# Patient Record
Sex: Male | Born: 1970 | Race: Black or African American | Hispanic: No | Marital: Single | Smoking: Never smoker
Health system: Southern US, Community
[De-identification: ages and names within clinical notes are randomized; demographics above are authoritative.]

## PROBLEM LIST (undated history)

## (undated) DIAGNOSIS — S43006A Unspecified dislocation of unspecified shoulder joint, initial encounter: Secondary | ICD-10-CM

---

## 1998-01-11 ENCOUNTER — Encounter: Admission: RE | Admit: 1998-01-11 | Discharge: 1998-01-11 | Payer: Self-pay | Admitting: *Deleted

## 1999-07-11 ENCOUNTER — Encounter: Payer: Self-pay | Admitting: Emergency Medicine

## 1999-07-11 ENCOUNTER — Emergency Department (HOSPITAL_COMMUNITY): Admission: EM | Admit: 1999-07-11 | Discharge: 1999-07-11 | Payer: Self-pay | Admitting: Emergency Medicine

## 2001-05-16 ENCOUNTER — Emergency Department (HOSPITAL_COMMUNITY): Admission: EM | Admit: 2001-05-16 | Discharge: 2001-05-16 | Payer: Self-pay | Admitting: Emergency Medicine

## 2002-02-02 ENCOUNTER — Inpatient Hospital Stay (HOSPITAL_COMMUNITY): Admission: AD | Admit: 2002-02-02 | Discharge: 2002-02-04 | Payer: Self-pay | Admitting: Internal Medicine

## 2002-02-04 ENCOUNTER — Encounter: Payer: Self-pay | Admitting: Internal Medicine

## 2002-11-05 ENCOUNTER — Emergency Department (HOSPITAL_COMMUNITY): Admission: EM | Admit: 2002-11-05 | Discharge: 2002-11-05 | Payer: Self-pay | Admitting: Emergency Medicine

## 2002-11-05 ENCOUNTER — Encounter: Payer: Self-pay | Admitting: Emergency Medicine

## 2006-11-04 ENCOUNTER — Emergency Department (HOSPITAL_COMMUNITY): Admission: EM | Admit: 2006-11-04 | Discharge: 2006-11-04 | Payer: Self-pay | Admitting: Emergency Medicine

## 2007-05-01 ENCOUNTER — Emergency Department (HOSPITAL_COMMUNITY): Admission: EM | Admit: 2007-05-01 | Discharge: 2007-05-01 | Payer: Self-pay | Admitting: Emergency Medicine

## 2010-10-20 ENCOUNTER — Inpatient Hospital Stay (INDEPENDENT_AMBULATORY_CARE_PROVIDER_SITE_OTHER)
Admission: RE | Admit: 2010-10-20 | Discharge: 2010-10-20 | Disposition: A | Discharge status: Home / Self Care | Payer: PRIVATE HEALTH INSURANCE | Source: Ambulatory Visit | Attending: Emergency Medicine | Admitting: Emergency Medicine

## 2010-10-20 DIAGNOSIS — J309 Allergic rhinitis, unspecified: Secondary | ICD-10-CM

## 2010-12-14 NOTE — Discharge Summary (Signed)
[place]. [facility]  Patient:    Andrew Potter, Andrew Potter Visit Number: [id number] MRN: [id number]          Service Type: MED Location: 5500 [id number] 02 Attending Physician:  Alva Garnet. Dictated by:   Merlene Laughter Renae Gloss, M.D. Admit Date:  02/02/2002 Discharge Date: 02/04/2002                             Discharge Summary  DISCHARGE DIAGNOSES: 1. Acute pancreatitis, resolved. 2. Nausea and vomiting. 3. Elevated liver function tests. 4. Human immunodeficiency virus.  HOSPITAL COURSE:  [name] was admitted for the diagnosis and evaluation of acute pancreatitis.  He had weight loss as an outpatient secondary to nausea, vomiting and inability to tolerate oral intake.  He has been on several retroviral medications.  All of which may have contributed to nausea, vomiting and increased liver function tests.  HIV consultation was obtained per Dr. Maurice March.  He states Zerit, D4T, is especially known for pancreatitis.  Zerit is one of [name] original medications.  Other causes of possible pancreatitis and increased liver function tests such as gallbladder disease was ruled out with a normal abdominal ultrasound today.  At discharge, [name] was eating without complications.  He did have some residual nausea; however, his symptoms have certainly improved off of his HIV medications.  He was ambulating without complications.  DISCHARGE MEDICATIONS: 1. Phenergan 25 mg one p.o. q.4h. p.r.n. nausea. 2. [name] will hold on his current medical regimen of Zerit, Epivir and    AZT.  As per ID recommendations, he will be started on a combination of    Caltro 3 p.o. b.i.d., Epivir 150 mg p.o. b.i.d. or Viread 200 mg    per day or Caltro, Viread and Sustiva 600 mg per day.  FOLLOW-UP: [name] will be seen by Dr. Renae Gloss within two weeks following discharge. Dictated by:   Merlene Laughter Renae Gloss, M.D. Attending Physician:  Andi Devon R. DD:  02/04/02 TD:   02/08/02 Job: [id number]

## 2010-12-14 NOTE — H&P (Signed)
[place]. [facility]  Patient:    Andrew Potter, Andrew Potter Visit Number: [id number] MRN: [id number]          Service Type: MED Location: 5500 5531 02 Attending Physician:  Alva Garnet. Dictated by:   Merlene Laughter Renae Gloss, M.D. Admit Date:  02/02/2002 Discharge Date: 02/04/2002                           History and Physical  CHIEF COMPLAINT: Nausea, vomiting, acute pancreatitis.  HISTORY OF PRESENT ILLNESS: The patient is a 40 year old gentleman who complains of a several week history of progressive nausea and vomiting and epigastric or abdominal pain.  He had been treated as an outpatient; however, states that Proton pump inhibitor therapy had not been helpful and he continued to have significant nausea and vomiting.  He had been unable to tolerate oral intake and a 10 pound weight loss had been noted over the past six weeks.  Outpatient laboratory evaluation was unremarkable with the exception of elevated lipase at 120.  He is admitted for further evaluation and treatment of acute pancreatitis.  ALLERGIES: NKDA.  MEDICATIONS:  1. Zerit 40 mg one p.o. q.d.  2. Epivir 150 mg one p.o. b.i.d.  3. Viramune 200 mg one p.o. b.i.d.  PAST MEDICAL HISTORY: HIV.  SOCIAL HISTORY: The patient is employed in the school system.  He denies tobacco, alcohol, or drugs of abuse.  FAMILY HISTORY: Significant for diabetes in grandmother.  REVIEW OF SYSTEMS: As per HPI and patient health history assessment.  Greater than 10 systems were reviewed.  PHYSICAL EXAMINATION:  GENERAL APPEARANCE: Well-developed, well-nourished black male in no acute distress.  VITAL SIGNS: Blood pressure 136/90, pulse 64.  Weight 128 pounds.  Temperature 97.6 degrees.  HEENT: TMs within normal limits bilaterally.  No oropharyngeal lesions.  NECK: Supple.  No masses.  Carotids 2+.  No bruits.  LUNGS: Clear to auscultation bilaterally.  HEART: S1 and S2.  Regular rate and rhythm.   No murmurs, rubs, or gallops.  ABDOMEN: Soft, nondistended.  Mild epigastric abdominal pain to palpation.  No masses.  Normoactive bowel sounds.  EXTREMITIES: No clubbing, cyanosis, or edema.  Pulses 2+ throughout.  NEUROLOGIC: Alert and oriented x3.  Cranial nerves intact.  ASSESSMENT/PLAN: Acute pancreatitis, most probable etiology is medication related.  All of his human immunodeficiency virus medications can cause gastrointestinal side effects such as nausea, vomiting, anorexia, and elevated liver function tests.  Whether or not this flare of pancreatitis is a result of his medications is unclear.  Human immunodeficiency virus disease also places him at higher risk for developing pancreatitis.  The patient also has slightly elevated liver function tests.  This along with his presentation may also indicate gallbladder disease.  An abdominal ultrasound will be obtained while hospitalized.  Of course, other possible etiologies could include gastroesophageal reflux disease or ulcer.  We will continue Proton pump inhibitor therapy.  He is hemodynamically stable and may require further gastrointestinal evaluation as an outpatient.Dictated by: Merlene Laughter Renae Gloss, M.D. Attending Physician:  Andi Devon R. DD:  02/03/02 TD:  02/06/02 Job: [id number]

## 2013-08-13 ENCOUNTER — Emergency Department (HOSPITAL_COMMUNITY)
Admission: EM | Admit: 2013-08-13 | Discharge: 2013-08-13 | Disposition: A | Discharge status: Home / Self Care | Payer: Self-pay | Attending: Emergency Medicine | Admitting: Emergency Medicine

## 2013-08-13 ENCOUNTER — Emergency Department (HOSPITAL_COMMUNITY): Payer: Self-pay

## 2013-08-13 ENCOUNTER — Encounter (HOSPITAL_COMMUNITY): Payer: Self-pay | Admitting: Emergency Medicine

## 2013-08-13 DIAGNOSIS — S43109A Unspecified dislocation of unspecified acromioclavicular joint, initial encounter: Secondary | ICD-10-CM | POA: Insufficient documentation

## 2013-08-13 DIAGNOSIS — Y929 Unspecified place or not applicable: Secondary | ICD-10-CM | POA: Insufficient documentation

## 2013-08-13 DIAGNOSIS — S43006A Unspecified dislocation of unspecified shoulder joint, initial encounter: Secondary | ICD-10-CM

## 2013-08-13 DIAGNOSIS — X500XXA Overexertion from strenuous movement or load, initial encounter: Secondary | ICD-10-CM | POA: Insufficient documentation

## 2013-08-13 DIAGNOSIS — Y9389 Activity, other specified: Secondary | ICD-10-CM | POA: Insufficient documentation

## 2013-08-13 MED ORDER — IBUPROFEN 800 MG PO TABS
800.0000 mg | ORAL_TABLET | Freq: Once | ORAL | Status: AC
  Administered 2013-08-13: 800 mg via ORAL
  Filled 2013-08-13: qty 1

## 2013-08-13 NOTE — Discharge Instructions (Signed)
Shoulder Dislocation ° Shoulder dislocation is when your upper arm bone (humerus) is forced out of your shoulder joint. Your doctor will put your shoulder back into the joint by pulling on your arm or through surgery. Your arm will be placed in a shoulder immobilizer or sling. The shoulder immobilizer or sling holds your shoulder in place while it heals. °HOME CARE  °· Rest your injured joint. Do not move it until instructed to do so. °· Put ice on your injured joint as told by your doctor. °· Put ice in a plastic bag. °· Place a towel between your skin and the bag. °· Leave the ice on for 15-20 minutes at a time, every 2 hours while you are awake. °· Only take medicines as told by your doctor. °· Squeeze a ball to exercise your hand. °GET HELP RIGHT AWAY IF:  °· Your splint or sling becomes damaged. °· Your pain becomes worse, not better. °· You lose feeling in your arm or hand. °· Your arm or hand becomes white or cold. °MAKE SURE YOU:  °· Understand these instructions. °· Will watch your condition. °· Will get help right away if you are not doing well or get worse. °Document Released: 10/07/2011 Document Reviewed: 10/07/2011 °ExitCare® Patient Information ©2014 [employer]. ° °

## 2013-08-13 NOTE — ED Notes (Signed)
Pt to room from x-ray.  St's feels like shoulder is back in place.  No deformity present at this time.

## 2013-08-13 NOTE — ED Provider Notes (Signed)
CSN: [id number]     Arrival date & time 08/13/13  2047 History   First MD Initiated Contact with Patient 08/13/13 2144     Chief Complaint  Patient presents with  . Shoulder Injury   (Consider location/radiation/quality/duration/timing/severity/associated sxs/prior Treatment) HPI 43 year old man who was lifting a table today and felt his shoulder come out of joint. He has had prior shoulder dislocations. He states that when he moved it here in the department he felt it slip back in. He has not had any numbness, tingling, or weakness in the extremity distal to this. He has had shoulder dislocations in the past and has followed up with an orthopedist but is unable to remember once. He has not had any surgery.  History reviewed. No pertinent past medical history. History reviewed. No pertinent past surgical history. No family history on file. History  Substance Use Topics  . Smoking status: Never Smoker   . Smokeless tobacco: Not on file  . Alcohol Use: Yes    Review of Systems  All other systems reviewed and are negative.    Allergies  Review of patient's allergies indicates no known allergies.  Home Medications   Current Outpatient Rx  Name  Route  Sig  Dispense  Refill  . efavirenz-emtricitabine-tenofovir (ATRIPLA) 600-200-300 MG per tablet   Oral   Take 1 tablet by mouth at bedtime.          BP 147/96  Pulse 98  Temp(Src) 98.1 F (36.7 C) (Oral)  Resp 16  SpO2 97% Physical Exam  Nursing note and vitals reviewed. Constitutional: He is oriented to person, place, and time. He appears well-developed and well-nourished.  HENT:  Head: Normocephalic and atraumatic.  Right Ear: External ear normal.  Left Ear: External ear normal.  Eyes: Conjunctivae are normal. Pupils are equal, round, and reactive to light.  Neck: Normal range of motion. Neck supple.  Cardiovascular: Normal rate, regular rhythm, normal heart sounds and intact distal pulses.   Pulmonary/Chest: Effort  normal and breath sounds normal.  Abdominal: Soft. Bowel sounds are normal.  Musculoskeletal: Normal range of motion. He exhibits no edema.       Arms: Shoulder with normal appearing contours.  Not ranged.  Elbow and hand normal. All sensation intact distal to injury.    Neurological: He is oriented to person, place, and time. He has normal reflexes.  Skin: Skin is warm and dry.  Psychiatric: He has a normal mood and affect. His behavior is normal. Judgment and thought content normal.    ED Course  Procedures (including critical care time) Labs Review Labs Reviewed - No data to display Imaging Review Dg Shoulder Left  08/13/2013   CLINICAL DATA:  Injury to left shoulder while moving heavy boxes; left lateral shoulder pain.  EXAM: LEFT SHOULDER - 2+ VIEW  COMPARISON:  Left shoulder radiographs performed 05/01/2007  FINDINGS: There is no evidence of fracture or dislocation. The left humeral head is seated within the glenoid fossa. The acromioclavicular joint is unremarkable in appearance. No significant soft tissue abnormalities are seen. The visualized portions of the left lung are clear.  IMPRESSION: No evidence of fracture or dislocation.   Electronically Signed   By: Roanna RaiderJeffery  Chang M.D.   On: 08/13/2013 21:58    EKG Interpretation   None       MDM  No diagnosis found. 43 year old male with history of left shoulder dislocations who dislocated his arm today lifting and has apparently spontaneously relocated it prior to x-[place]itt [place]itt. There  is no evidence of fracture in his neuro vascularly intact. He will be placed in an immobilizer and is given referral to orthopedic surgery  Hilario Quarry, MD 08/13/13 2256

## 2013-08-13 NOTE — ED Notes (Signed)
The pt is c/o lt shoulder pain.  He was lifting a couch when his shoulder dislocated.  He has a history of dislocated shoulders on the lt

## 2013-08-20 ENCOUNTER — Emergency Department (HOSPITAL_COMMUNITY): Payer: PRIVATE HEALTH INSURANCE

## 2013-08-20 ENCOUNTER — Encounter (HOSPITAL_COMMUNITY): Payer: Self-pay | Admitting: Emergency Medicine

## 2013-08-20 ENCOUNTER — Emergency Department (HOSPITAL_COMMUNITY)
Admission: EM | Admit: 2013-08-20 | Discharge: 2013-08-20 | Disposition: A | Discharge status: Home / Self Care | Payer: PRIVATE HEALTH INSURANCE | Attending: Emergency Medicine | Admitting: Emergency Medicine

## 2013-08-20 DIAGNOSIS — Y9389 Activity, other specified: Secondary | ICD-10-CM | POA: Insufficient documentation

## 2013-08-20 DIAGNOSIS — X500XXA Overexertion from strenuous movement or load, initial encounter: Secondary | ICD-10-CM | POA: Insufficient documentation

## 2013-08-20 DIAGNOSIS — S43006A Unspecified dislocation of unspecified shoulder joint, initial encounter: Secondary | ICD-10-CM | POA: Insufficient documentation

## 2013-08-20 DIAGNOSIS — Y9289 Other specified places as the place of occurrence of the external cause: Secondary | ICD-10-CM | POA: Insufficient documentation

## 2013-08-20 HISTORY — DX: Unspecified dislocation of unspecified shoulder joint, initial encounter: S43.006A

## 2013-08-20 MED ORDER — ONDANSETRON HCL 4 MG/2ML IJ SOLN
4.0000 mg | Freq: Once | INTRAMUSCULAR | Status: AC
  Administered 2013-08-20: 4 mg via INTRAVENOUS
  Filled 2013-08-20: qty 2

## 2013-08-20 MED ORDER — HYDROMORPHONE HCL PF 1 MG/ML IJ SOLN
1.0000 mg | Freq: Once | INTRAMUSCULAR | Status: AC
  Administered 2013-08-20: 1 mg via INTRAVENOUS
  Filled 2013-08-20: qty 1

## 2013-08-20 MED ORDER — FENTANYL CITRATE 0.05 MG/ML IJ SOLN
100.0000 ug | Freq: Once | INTRAMUSCULAR | Status: AC
  Administered 2013-08-20: 100 ug via INTRAVENOUS
  Filled 2013-08-20: qty 2

## 2013-08-20 NOTE — Progress Notes (Addendum)
Orthopedic Tech Progress Note Patient Details:  Andrew GhaziMichael J Potter 11/12/1970 [id number] Shoulder reduction performed by doctor and immobilizer applied. Ortho Devices Type of Ortho Device: Shoulder immobilizer Ortho Device/Splint Location: Left UE Ortho Device/Splint Interventions: Application   Asia R Thompson 08/20/2013, 10:18 AM

## 2013-08-20 NOTE — Discharge Instructions (Signed)
Shoulder Dislocation  Shoulder dislocation is when your upper arm bone (humerus) is forced out of your shoulder joint. Your doctor will put your shoulder back into the joint by pulling on your arm or through surgery. Your arm will be placed in a shoulder immobilizer or sling. The shoulder immobilizer or sling holds your shoulder in place while it heals. HOME CARE   Rest your injured joint. Do not move it until instructed to do so.  Put ice on your injured joint as told by your doctor.  Put ice in a plastic bag.  Place a towel between your skin and the bag.  Leave the ice on for 15-20 minutes at a time, every 2 hours while you are awake.  Only take medicines as told by your doctor.  Squeeze a ball to exercise your hand. GET HELP RIGHT AWAY IF:   Your splint or sling becomes damaged.  Your pain becomes worse, not better.  You lose feeling in your arm or hand.  Your arm or hand becomes white or cold. MAKE SURE YOU:   Understand these instructions.  Will watch your condition.  Will get help right away if you are not doing well or get worse. Document Released: 10/07/2011 Document Reviewed: 10/07/2011 [facility] Patient Information 2014 [place], [place].  Dislocation or Subluxation Dislocation of a joint occurs when ends of two or more adjacent bones no longer touch each other. A subluxation is a minor form of a dislocation, in which two or more adjacent bones are no longer properly aligned. The most common joints susceptible to a dislocation are the shoulder, kneecap, and fingers.  SYMPTOMS   Sudden pain at the time of injury.  Noticeable deformity in the area of the joint.  Limited range of motion. CAUSES   Usually a traumatic injury that stretches or tears ligaments that surround a joint and hold the bones together.  Condition present at birth (congenital) in which the joint surfaces are shallow or abnormally formed.  Joint disease such as arthritis or other diseases of  ligaments and tissues around a joint. RISK INCREASES WITH:  Repeated injury to a joint.  Previous dislocation of a joint.  Contact sports (football, rugby, hockey, lacrosse) or sports that require repetitive overhead arm motion (throwing, swimming, volleyball).  Rheumatoid arthritis.  Congenital joint condition. PREVENTION  Warm up and stretch properly before activity.  Maintain physical fitness:  Joint flexibility.  Muscle strength and endurance.  Cardiovascular fitness.  Wear proper protective equipment and ensure correct fit.  Learn and use proper technique. PROGNOSIS  This condition is usually curable with prompt treatment. After the dislocation has been put back in place, the joint may require immobilization with a cast, splint, or sling for 2 to 6 weeks, often followed by strength and stretching exercises that may be performed at home or with a therapist. RELATED COMPLICATIONS   Damage to nearby nerves or major blood vessels, causing numbness, coldness, or paleness.  Recurrent injury to the joint.  Arthritis of affected joint.  Fracture of joint. TREATMENT Treatment initially involves realigning the bones (reduction) of the joint. Reductions should only be performed by someone who is trained in the procedure. After the joint is reduced, medicine and ice should be used to reduce pain and inflammation. The joint may be immobilized to allow for the muscles and ligaments to heal. If a joint is subjected to recurrent dislocations, surgery may be necessary to tighten or replace injured structures. After surgery, stretching and strengthening exercises may be required.  These may be performed at home or with a therapist. MEDICATION   Patients may require medicine to help them relax (sedative) or muscle relaxants in order to reduce the joint.  If pain medicine is necessary, nonsteroidal anti-inflammatory medicines, such as aspirin and ibuprofen, or other minor pain relievers,  such as acetaminophen, are often recommended.  Do not take pain medicine for 7 days before surgery.  Prescription pain relievers may be necessary. Use only as directed and only as much as you need. SEEK MEDICAL CARE IF:   Symptoms get worse or do not improve despite treatment.  You have difficulty moving a joint after injury.  Any extremity becomes numb, pale, or cool after injury. This is an emergency.  Dislocations or subluxations occur repeatedly. Document Released: 07/15/2005 Document Revised: 10/07/2011 Document Reviewed: 10/27/2008 [facility] Patient Information 2014 [place], [place].

## 2013-08-20 NOTE — ED Notes (Signed)
Hannah, PA back at the bedside.  

## 2013-08-20 NOTE — ED Notes (Signed)
Andrew Potter ClientHannah, PA back at the bedside to put shoulder back in place.

## 2013-08-20 NOTE — ED Provider Notes (Signed)
CSN: [id number]     Arrival date & time 08/20/13  [id number] History   First MD Initiated Contact with Patient 08/20/13 [id number]     Chief Complaint  Patient presents with  . Dislocation   (Consider location/radiation/quality/duration/timing/severity/associated sxs/prior Treatment) HPI Comments: Patient is a 43 year old male who presents today with left shoulder pain. He has history of dislocating his shoulder previously. Today he was reaching down to grab the garage door on the floor of his car when he felt his left shoulder dislocate. He has been unable to move the arm since that time. He denies any numbness, paresthesias. He was seen for the same on 1/16. He has not been wearing his sling because he needs to go to work and does not want people at work to think less of him. He describes his pain as an aching, 6/10 pain.   The history is provided by the patient. No language interpreter was used.    Past Medical History  Diagnosis Date  . Dislocated shoulder    History reviewed. No pertinent past surgical history. No family history on file. History  Substance Use Topics  . Smoking status: Never Smoker   . Smokeless tobacco: Not on file  . Alcohol Use: Yes    Review of Systems  Constitutional: Negative for fever and chills.  Respiratory: Negative for shortness of breath.   Cardiovascular: Negative for chest pain.  Gastrointestinal: Negative for nausea, vomiting and abdominal pain.  Musculoskeletal: Positive for arthralgias, joint swelling and myalgias.  All other systems reviewed and are negative.    Allergies  Review of patient's allergies indicates no known allergies.  Home Medications   Current Outpatient Rx  Name  Route  Sig  Dispense  Refill  . efavirenz-emtricitabine-tenofovir (ATRIPLA) 600-200-300 MG per tablet   Oral   Take 1 tablet by mouth at bedtime.          BP 160/93  Temp(Src) 97.6 F (36.4 C) (Oral)  Resp 24  Ht 5\' 3"  (1.6 m)  Wt 110 lb (49.896 kg)  BMI  19.49 kg/m2  SpO2 99% Physical Exam  Nursing note and vitals reviewed. Constitutional: He is oriented to person, place, and time. He appears well-developed and well-nourished. No distress.  HENT:  Head: Normocephalic and atraumatic.  Right Ear: External ear normal.  Left Ear: External ear normal.  Nose: Nose normal.  Eyes: Conjunctivae are normal.  Neck: Normal range of motion. No tracheal deviation present.  Cardiovascular: Normal rate, regular rhythm, normal heart sounds, intact distal pulses and normal pulses.   Pulses:      Radial pulses are 2+ on the right side, and 2+ on the left side.  Pulmonary/Chest: Effort normal and breath sounds normal. No stridor.  Abdominal: Soft. He exhibits no distension. There is no tenderness.  Musculoskeletal: Normal range of motion.       Arms: ttp palpation over left shoulder. Mild deformity seen at shoulder. Sensation intact. Cap refill < 3 seconds in all fingers.   Neurological: He is alert and oriented to person, place, and time.  Skin: Skin is warm and dry. He is not diaphoretic.  Psychiatric: He has a normal mood and affect. His behavior is normal.    ED Course  Reduction of dislocation Date/Time: 08/20/2013 11:05 AM Performed by: Mora BellmanMERRELL, Rahima Fleishman S Authorized by: Mora BellmanMERRELL, Lafayette Dunlevy S Consent: Verbal consent obtained. written consent obtained. The procedure was performed in an emergent situation. Risks and benefits: risks, benefits and alternatives were discussed Consent given by: patient Patient  understanding: patient states understanding of the procedure being performed Patient consent: the patient's understanding of the procedure matches consent given Required items: required blood products, implants, devices, and special equipment available Patient identity confirmed: verbally with patient and arm band Time out: Immediately prior to procedure a "time out" was called to verify the correct patient, procedure, equipment, support staff and  site/side marked as required. Local anesthesia used: no Patient sedated: no Patient tolerance: Patient tolerated the procedure well with no immediate complications.   (including critical care time) Labs Review Labs Reviewed - No data to display Imaging Review Dg Shoulder Left  08/20/2013   CLINICAL DATA:  Shoulder dislocation.  EXAM: LEFT SHOULDER - 2+ VIEW  COMPARISON:  08/13/2013  FINDINGS: There is anteroinferior dislocation of the humeral head. No fracture is identified. Acromioclavicular joint is unchanged in appearance from prior studies. No soft tissue abnormality is identified. Visualized portion of the left lung is clear.  IMPRESSION: Anterior left glenohumeral dislocation.   Electronically Signed   By: Sebastian Ache   On: 08/20/2013 09:15   Dg Shoulder Left Port  08/20/2013   CLINICAL DATA:  Status post shoulder dislocation reduction.  EXAM: PORTABLE LEFT SHOULDER - 2+ VIEW  COMPARISON:  Shoulder radiographs from 0905 hr the same day  FINDINGS: Glenohumeral joint has been reduced.  No fracture is identified.  IMPRESSION: Interval reduction of left shoulder dislocation.   Electronically Signed   By: Sebastian Ache   On: 08/20/2013 10:49    EKG Interpretation   None       MDM   1. Shoulder dislocation    Pt presents to ED with shoulder dislocation. This is recurrent. Neurovascularly intact. Sensation intact. Grip strength 5/5. I was able to reduce the shoulder in ED without sedation. Patient received 1mg  of dilaudid and of fentanyl. Post reduction film shows successful reduction of dislocation. Patient was placed in a sling. He was given ortho referral. He understands importance of this follow up. Return instructions given. Vital signs stable for discharge. I discussed case with Dr. Criss Alvine who agrees with plan. Patient / Family / Caregiver informed of clinical course, understand medical decision-making process, and agree with plan.     Mora Bellman, PA-C 08/20/13  1112

## 2013-08-20 NOTE — ED Notes (Signed)
Pt was here Friday for dislocated left shoulder. Presents today for same he says.

## 2013-08-21 NOTE — ED Provider Notes (Signed)
Medical screening examination/treatment/procedure(s) were performed by non-physician practitioner and as supervising physician I was immediately available for consultation/collaboration.  EKG Interpretation   None         Audree CamelScott T Kloi Brodman, MD 08/21/13 1134

## 2014-03-18 ENCOUNTER — Telehealth: Payer: Self-pay

## 2014-03-18 NOTE — Telephone Encounter (Signed)
Patient contacted regarding new intake appointment. Date and time given. Information given regarding documents needed to qualify for financial eligibility.  Raven Furnas K Aslynn Brunetti, RN  

## 2014-03-31 ENCOUNTER — Ambulatory Visit (INDEPENDENT_AMBULATORY_CARE_PROVIDER_SITE_OTHER): Payer: Self-pay

## 2014-03-31 DIAGNOSIS — R768 Other specified abnormal immunological findings in serum: Secondary | ICD-10-CM

## 2014-03-31 DIAGNOSIS — Z79899 Other long term (current) drug therapy: Secondary | ICD-10-CM

## 2014-03-31 DIAGNOSIS — Z113 Encounter for screening for infections with a predominantly sexual mode of transmission: Secondary | ICD-10-CM

## 2014-03-31 DIAGNOSIS — Z23 Encounter for immunization: Secondary | ICD-10-CM

## 2014-03-31 DIAGNOSIS — B20 Human immunodeficiency virus [HIV] disease: Secondary | ICD-10-CM

## 2014-03-31 LAB — CBC WITH DIFFERENTIAL/PLATELET
Basophils Absolute: 0 10*3/uL (ref 0.0–0.1)
Basophils Relative: 0 % (ref 0–1)
Eosinophils Absolute: 0 10*3/uL (ref 0.0–0.7)
Eosinophils Relative: 0 % (ref 0–5)
HCT: 36.9 % — ABNORMAL LOW (ref 39.0–52.0)
Hemoglobin: 13 g/dL (ref 13.0–17.0)
LYMPHS ABS: 1.9 10*3/uL (ref 0.7–4.0)
LYMPHS PCT: 24 % (ref 12–46)
MCH: 31.1 pg (ref 26.0–34.0)
MCHC: 35.2 g/dL (ref 30.0–36.0)
MCV: 88.3 fL (ref 78.0–100.0)
Monocytes Absolute: 1.2 10*3/uL — ABNORMAL HIGH (ref 0.1–1.0)
Monocytes Relative: 15 % — ABNORMAL HIGH (ref 3–12)
NEUTROS ABS: 4.8 10*3/uL (ref 1.7–7.7)
NEUTROS PCT: 61 % (ref 43–77)
Platelets: 326 10*3/uL (ref 150–400)
RBC: 4.18 MIL/uL — ABNORMAL LOW (ref 4.22–5.81)
RDW: 14.2 % (ref 11.5–15.5)
WBC: 7.8 10*3/uL (ref 4.0–10.5)

## 2014-04-01 LAB — COMPLETE METABOLIC PANEL WITH GFR
ALBUMIN: 3.6 g/dL (ref 3.5–5.2)
ALT: 24 U/L (ref 0–53)
AST: 16 U/L (ref 0–37)
Alkaline Phosphatase: 63 U/L (ref 39–117)
BILIRUBIN TOTAL: 0.3 mg/dL (ref 0.2–1.2)
BUN: 8 mg/dL (ref 6–23)
CO2: 28 mEq/L (ref 19–32)
Calcium: 9.1 mg/dL (ref 8.4–10.5)
Chloride: 102 mEq/L (ref 96–112)
Creat: 0.9 mg/dL (ref 0.50–1.35)
GFR, Est African American: >89 mL/min
GFR, Est Non African American: >89 mL/min
GLUCOSE: 75 mg/dL (ref 70–99)
Potassium: 3.4 mEq/L — ABNORMAL LOW (ref 3.5–5.3)
Sodium: 138 mEq/L (ref 135–145)
TOTAL PROTEIN: 6.7 g/dL (ref 6.0–8.3)

## 2014-04-01 LAB — HEPATITIS B SURFACE ANTIBODY,QUALITATIVE: HEP B S AB: POSITIVE — AB

## 2014-04-01 LAB — URINALYSIS
Bilirubin Urine: NEGATIVE
Glucose, UA: 100 mg/dL — AB
Hgb urine dipstick: NEGATIVE
Ketones, ur: NEGATIVE mg/dL
Leukocytes, UA: NEGATIVE
Nitrite: NEGATIVE
Protein, ur: NEGATIVE mg/dL
Specific Gravity, Urine: 1.009 (ref 1.005–1.030)
Urobilinogen, UA: 0.2 mg/dL (ref 0.0–1.0)
pH: 6 (ref 5.0–8.0)

## 2014-04-01 LAB — T-HELPER CELL (CD4) - (RCID CLINIC ONLY)
CD4 T CELL HELPER: 30 % — AB (ref 33–55)
CD4 T Cell Abs: 610 /uL (ref 400–2700)

## 2014-04-01 LAB — HEPATITIS B CORE ANTIBODY, TOTAL: Hep B Core Total Ab: REACTIVE — AB

## 2014-04-01 LAB — LIPID PANEL
Cholesterol: 98 mg/dL (ref 0–200)
HDL: 41 mg/dL
LDL Cholesterol: 37 mg/dL (ref 0–99)
Total CHOL/HDL Ratio: 2.4 ratio
Triglycerides: 101 mg/dL
VLDL: 20 mg/dL (ref 0–40)

## 2014-04-01 LAB — HEPATITIS C ANTIBODY: HCV Ab: NEGATIVE

## 2014-04-01 LAB — SYPHILIS: RPR W/REFLEX TO RPR TITER AND TREPONEMAL ANTIBODIES, TRADITIONAL SCREENING AND DIAGNOSIS ALGORITHM

## 2014-04-01 LAB — HEPATITIS B SURFACE ANTIGEN: HEP B S AG: NEGATIVE

## 2014-04-01 LAB — HEPATITIS A ANTIBODY, TOTAL: Hep A Total Ab: NONREACTIVE

## 2014-04-01 LAB — HIV-1 RNA ULTRAQUANT REFLEX TO GENTYP+

## 2014-04-02 LAB — QUANTIFERON TB GOLD ASSAY (BLOOD)
Interferon Gamma Release Assay: NEGATIVE
Quantiferon Nil Value: 0.22 IU/mL
Quantiferon Tb Ag Minus Nil Value: 0 IU/mL
TB AG VALUE: 0.2 [IU]/mL

## 2014-04-05 DIAGNOSIS — R768 Other specified abnormal immunological findings in serum: Secondary | ICD-10-CM | POA: Insufficient documentation

## 2014-04-05 NOTE — Progress Notes (Signed)
Patient is transferring HIV care to [facility].  He was diagnosed in 2002 and states 100% compliance with Atripla. He is interested in switching medications due to vivid dreams and nightmares.  He states he sometimes hears voices at night with dreams.  No tattoos or piercings.  Vaccines updated.    Laurell Josephs, RN

## 2014-04-06 ENCOUNTER — Other Ambulatory Visit: Payer: Self-pay | Admitting: *Deleted

## 2014-04-06 DIAGNOSIS — B20 Human immunodeficiency virus [HIV] disease: Secondary | ICD-10-CM

## 2014-04-06 MED ORDER — EFAVIRENZ-EMTRICITAB-TENOFOVIR 600-200-300 MG PO TABS: 1.0000 | ORAL_TABLET | Freq: Every day | ORAL | Status: DC

## 2014-04-06 NOTE — Telephone Encounter (Signed)
ADAP Application 

## 2014-04-08 LAB — HLA B*5701: HLA-B*5701 w/rflx HLA-B High: NEGATIVE

## 2014-04-19 ENCOUNTER — Encounter: Payer: Self-pay | Admitting: Internal Medicine

## 2014-04-19 ENCOUNTER — Ambulatory Visit (INDEPENDENT_AMBULATORY_CARE_PROVIDER_SITE_OTHER): Payer: Self-pay | Admitting: Internal Medicine

## 2014-04-19 VITALS — BP 138/103 | HR 91 | Temp 97.1°F | Wt 120.0 lb

## 2014-04-19 DIAGNOSIS — B20 Human immunodeficiency virus [HIV] disease: Secondary | ICD-10-CM

## 2014-04-19 DIAGNOSIS — R768 Other specified abnormal immunological findings in serum: Secondary | ICD-10-CM

## 2014-04-19 DIAGNOSIS — R894 Abnormal immunological findings in specimens from other organs, systems and tissues: Secondary | ICD-10-CM

## 2014-04-19 DIAGNOSIS — Z21 Asymptomatic human immunodeficiency virus [HIV] infection status: Secondary | ICD-10-CM

## 2014-04-19 MED ORDER — ELVITEG-COBIC-EMTRICIT-TENOFDF 150-150-200-300 MG PO TABS
1.0000 | ORAL_TABLET | Freq: Every day | ORAL | Status: DC
Start: 2014-04-19 — End: 2014-11-05

## 2014-04-19 NOTE — Progress Notes (Signed)
Patient ID: Andrew Potter, male   DOB: 06-29-1971, 43 y.o.   MRN: [id number]      RFV: transferring care, establishing care with new c  Patient ID: Andrew Potter, male   DOB: Jul 12, 1971, 43 y.o.   MRN: [id number]  HPI 43yo M with HIV disease, dx in 2002. CD 4 count of 610/VL<20, now on atripla for the past 5 years. Still having side effects of having insomnia, and vivid dreams. Does well taking meds, great adherence. No copies of his medical records presently.  Previously saw Dr. Andi Devon, last visit was roughly 1 year ago. Has applied for ADAP.   Social hx: works PT with [employer]. No smoking or drinking. He is guarded to disclose if he is in a relationship or not. Lives in [place]. Has family in [place]. Has not disclosed to his family or friends.    Outpatient Encounter Prescriptions as of 04/19/2014  Medication Sig  . efavirenz-emtricitabine-tenofovir (ATRIPLA) 600-200-300 MG per tablet Take 1 tablet by mouth at bedtime.  . Naproxen Sodium (ALEVE PO) Take 2 tablets by mouth 2 (two) times daily as needed (pain).     Patient Active Problem List   Diagnosis Date Noted  . 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 Tetanus/tdap  03/13/1990    History  Substance Use Topics  . Smoking status: Never Smoker   . Smokeless tobacco: Not on file  . Alcohol Use: Yes  family history is not on file. Review of Systems Review of Systems  Constitutional: Negative for fever, chills, diaphoresis, activity change, appetite change, fatigue and unexpected weight change.  HENT: Negative for congestion, sore throat, rhinorrhea, sneezing, trouble swallowing and sinus pressure.  Eyes: Negative for photophobia and visual disturbance.  Respiratory: Negative for cough, chest tightness, shortness of breath, wheezing and stridor.  Cardiovascular: Negative for chest pain, palpitations and leg swelling.  Gastrointestinal: Negative for nausea,  vomiting, abdominal pain, diarrhea, constipation, blood in stool, abdominal distention and anal bleeding.  Genitourinary: Negative for dysuria, hematuria, flank pain and difficulty urinating.  Musculoskeletal: Negative for myalgias, back pain, joint swelling, arthralgias and gait problem.  Skin: Negative for color change, pallor, rash and wound.  Neurological: Negative for dizziness, tremors, weakness and light-headedness.  Hematological: Negative for adenopathy. Does not bruise/bleed easily.  Psychiatric/Behavioral: Negative for behavioral problems, confusion, sleep disturbance, dysphoric mood, decreased concentration and agitation.    Physical Exam   BP 138/103  Pulse 91  Temp(Src) 97.1 F (36.2 C) (Oral)  Wt 120 lb (54.432 kg) Physical Exam  Constitutional: He is oriented to person, place, and time. He appears well-developed and well-nourished. No distress. Small frame HENT: enlarged parotid glands, non tender Mouth/Throat: Oropharynx is clear and moist. No oropharyngeal exudate.  Cardiovascular: Normal rate, regular rhythm and normal heart sounds. Exam reveals no gallop and no friction rub.  No murmur heard.  Pulmonary/Chest: Effort normal and breath sounds normal. No respiratory distress. He has no wheezes.  Abdominal: Soft. Bowel sounds are normal. He exhibits no distension. There is no tenderness.  Lymphadenopathy:  He has no cervical adenopathy.  Neurological: He is alert and oriented to person, place, and time.  Skin: Skin is warm and dry. No rash noted. No erythema.  Psychiatric: guarded, somewhat flat affect   Lab Results  Component Value Date   CD4TCELL 30* 03/31/2014   Lab Results  Component Value Date   CD4TABS 610 03/31/2014   Lab Results  Component Value Date   HIV1RNAQUANT <20 03/31/2014   Lab Results  Component Value Date   HEPBSAB POS* 03/31/2014   No results found for this basename: RPR    CBC Lab Results  Component Value Date   WBC 7.8 03/31/2014   RBC  4.18* 03/31/2014   HGB 13.0 03/31/2014   HCT 36.9* 03/31/2014   PLT 326 03/31/2014   MCV 88.3 03/31/2014   MCH 31.1 03/31/2014   MCHC 35.2 03/31/2014   RDW 14.2 03/31/2014   LYMPHSABS 1.9 03/31/2014   MONOABS 1.2* 03/31/2014   EOSABS 0.0 03/31/2014   BASOSABS 0.0 03/31/2014   BMET Lab Results  Component Value Date   NA 138 03/31/2014   K 3.4* 03/31/2014   CL 102 03/31/2014   CO2 28 03/31/2014   GLUCOSE 75 03/31/2014   BUN 8 03/31/2014   CREATININE 0.90 03/31/2014   CALCIUM 9.1 03/31/2014   GFRNONAA >89 03/31/2014   GFRAA >89 03/31/2014     Assessment and Plan   HIv = will switch to strilbild and discontinue atripla. Will get copies of his medical records from [facility], but patient "wants to filter what gets sent to the clinic"  Health maintenance =already received flu vaccination 2 wks ago  Vivid dreams = hopefully will improve since atripla discontinue.  Disclosure status = offered for him to see Bernette Redbird to discuss ways to disclose to family or others  Hep B Core Ab positive  = no need for further evaluation  rtc in 4 wk to see how he is doing

## 2014-06-01 ENCOUNTER — Ambulatory Visit (INDEPENDENT_AMBULATORY_CARE_PROVIDER_SITE_OTHER): Payer: Self-pay | Admitting: Internal Medicine

## 2014-06-01 ENCOUNTER — Encounter: Payer: Self-pay | Admitting: Internal Medicine

## 2014-06-01 ENCOUNTER — Ambulatory Visit: Payer: Self-pay | Admitting: Internal Medicine

## 2014-06-01 VITALS — BP 119/73 | HR 72 | Temp 97.2°F | Wt 126.0 lb

## 2014-06-01 DIAGNOSIS — B20 Human immunodeficiency virus [HIV] disease: Secondary | ICD-10-CM

## 2014-06-01 DIAGNOSIS — Z21 Asymptomatic human immunodeficiency virus [HIV] infection status: Secondary | ICD-10-CM

## 2014-06-01 LAB — CBC WITH DIFFERENTIAL/PLATELET
BASOS ABS: 0 10*3/uL (ref 0.0–0.1)
BASOS PCT: 0 % (ref 0–1)
Eosinophils Absolute: 0.2 10*3/uL (ref 0.0–0.7)
Eosinophils Relative: 3 % (ref 0–5)
HCT: 40 % (ref 39.0–52.0)
HEMOGLOBIN: 13.9 g/dL (ref 13.0–17.0)
Lymphocytes Relative: 42 % (ref 12–46)
Lymphs Abs: 2.3 10*3/uL (ref 0.7–4.0)
MCH: 31.4 pg (ref 26.0–34.0)
MCHC: 34.8 g/dL (ref 30.0–36.0)
MCV: 90.5 fL (ref 78.0–100.0)
Monocytes Absolute: 0.6 10*3/uL (ref 0.1–1.0)
Monocytes Relative: 10 % (ref 3–12)
NEUTROS ABS: 2.5 10*3/uL (ref 1.7–7.7)
Neutrophils Relative %: 45 % (ref 43–77)
Platelets: 239 10*3/uL (ref 150–400)
RBC: 4.42 MIL/uL (ref 4.22–5.81)
RDW: 14.3 % (ref 11.5–15.5)
WBC: 5.5 10*3/uL (ref 4.0–10.5)

## 2014-06-01 LAB — COMPLETE METABOLIC PANEL WITH GFR
ALBUMIN: 4.1 g/dL (ref 3.5–5.2)
ALK PHOS: 58 U/L (ref 39–117)
ALT: 43 U/L (ref 0–53)
AST: 31 U/L (ref 0–37)
BILIRUBIN TOTAL: 0.3 mg/dL (ref 0.2–1.2)
BUN: 10 mg/dL (ref 6–23)
CO2: 25 mEq/L (ref 19–32)
Calcium: 9.8 mg/dL (ref 8.4–10.5)
Chloride: 105 mEq/L (ref 96–112)
Creat: 1.02 mg/dL (ref 0.50–1.35)
GFR, Est African American: >89 mL/min
Glucose, Bld: 88 mg/dL (ref 70–99)
Potassium: 4.5 mEq/L (ref 3.5–5.3)
SODIUM: 141 meq/L (ref 135–145)
TOTAL PROTEIN: 7.1 g/dL (ref 6.0–8.3)

## 2014-06-01 NOTE — Progress Notes (Signed)
Patient ID: Andrew GhaziMichael J Potter, male   DOB: 12-Aug-1970, 43 y.o.   MRN: [id number]       Patient ID: Andrew GhaziMichael J Potter, male   DOB: 12-Aug-1970, 43 y.o.   MRN: [id number]  HPI 43yo M with HIV disease, CD 4 count of 610/VL<20 swithced to stribild in sept off of atripla due to SE. He is doing well with switching over to stribild. Not having difficulty. He denies any recent illness  Outpatient Encounter Prescriptions as of 06/01/2014  Medication Sig  . elvitegravir-cobicistat-emtricitabine-tenofovir (STRIBILD) 150-150-200-300 MG TABS tablet Take 1 tablet by mouth daily with breakfast.  . Naproxen Sodium (ALEVE PO) Take 2 tablets by mouth 2 (two) times daily as needed (pain).     Patient Active Problem List   Diagnosis Date Noted  . Asymptomatic HIV infection 04/19/2014  . 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 TETANUS/TDAP  03/13/1990     Review of Systems  Constitutional: Negative for fever, chills, diaphoresis, activity change, appetite change, fatigue and unexpected weight change.  HENT: Negative for congestion, sore throat, rhinorrhea, sneezing, trouble swallowing and sinus pressure.  Eyes: Negative for photophobia and visual disturbance.  Respiratory: Negative for cough, chest tightness, shortness of breath, wheezing and stridor.  Cardiovascular: Negative for chest pain, palpitations and leg swelling.  Gastrointestinal: Negative for nausea, vomiting, abdominal pain, diarrhea, constipation, blood in stool, abdominal distention and anal bleeding.  Genitourinary: Negative for dysuria, hematuria, flank pain and difficulty urinating.  Musculoskeletal: Negative for myalgias, back pain, joint swelling, arthralgias and gait problem.  Skin: Negative for color change, pallor, rash and wound.  Neurological: Negative for dizziness, tremors, weakness and light-headedness.  Hematological: Negative for adenopathy. Does not bruise/bleed easily.    Psychiatric/Behavioral: Negative for behavioral problems, confusion, sleep disturbance, dysphoric mood, decreased concentration and agitation.   Physical Exam   BP 119/73 mmHg  Pulse 72  Temp(Src) 97.2 F (36.2 C) (Oral)  Wt 126 lb (57.153 kg) Physical Exam  Constitutional: He is oriented to person, place, and time. He appears well-developed and well-nourished. No distress.  HENT:  Mouth/Throat: Oropharynx is clear and moist. No oropharyngeal exudate.  Cardiovascular: Normal rate, regular rhythm and normal heart sounds. Exam reveals no gallop and no friction rub.  No murmur heard.  Pulmonary/Chest: Effort normal and breath sounds normal. No respiratory distress. He has no wheezes.  Abdominal: Soft. Bowel sounds are normal. He exhibits no distension. There is no tenderness.  Lymphadenopathy:  He has no cervical adenopathy.  Neurological: He is alert and oriented to person, place, and time.  Skin: Skin is warm and dry. No rash noted. No erythema.  Psychiatric: He has a normal mood and affect. His behavior is normal.    Lab Results  Component Value Date   CD4TCELL 30* 03/31/2014   Lab Results  Component Value Date   CD4TABS 610 03/31/2014   Lab Results  Component Value Date   HIV1RNAQUANT <20 03/31/2014   Lab Results  Component Value Date   HEPBSAB POS* 03/31/2014   No results found for: RPR  CBC Lab Results  Component Value Date   WBC 7.8 03/31/2014   RBC 4.18* 03/31/2014   HGB 13.0 03/31/2014   HCT 36.9* 03/31/2014   PLT 326 03/31/2014   MCV 88.3 03/31/2014   MCH 31.1 03/31/2014   MCHC 35.2 03/31/2014   RDW 14.2 03/31/2014   LYMPHSABS 1.9 03/31/2014   MONOABS 1.2* 03/31/2014   EOSABS 0.0  03/31/2014   BASOSABS 0.0 03/31/2014   BMET Lab Results  Component Value Date   NA 138 03/31/2014   K 3.4* 03/31/2014   CL 102 03/31/2014   CO2 28 03/31/2014   GLUCOSE 75 03/31/2014   BUN 8 03/31/2014   CREATININE 0.90 03/31/2014   CALCIUM 9.1 03/31/2014    GFRNONAA >89 03/31/2014   GFRAA >89 03/31/2014     Assessment and Plan   hiv = will check cd 4 count ,viral load, cbc and bmp. Will continue on stribild   Health maintenance = up to date.   rtc [id number]

## 2014-06-02 LAB — T-HELPER CELL (CD4) - (RCID CLINIC ONLY)
CD4 T CELL ABS: 610 /uL (ref 400–2700)
CD4 T CELL HELPER: 27 % — AB (ref 33–55)

## 2014-06-03 LAB — HIV-1 RNA QUANT-NO REFLEX-BLD
HIV 1 RNA Quant: <20 copies/mL (ref <20)
HIV-1 RNA Quant, Log: <1.3 {Log} (ref <1.30)

## 2014-10-06 ENCOUNTER — Ambulatory Visit (INDEPENDENT_AMBULATORY_CARE_PROVIDER_SITE_OTHER): Payer: Self-pay | Admitting: Internal Medicine

## 2014-10-06 ENCOUNTER — Encounter: Payer: Self-pay | Admitting: Internal Medicine

## 2014-10-06 VITALS — BP 112/68 | HR 59 | Temp 97.7°F | Wt 127.0 lb

## 2014-10-06 DIAGNOSIS — B20 Human immunodeficiency virus [HIV] disease: Secondary | ICD-10-CM

## 2014-10-06 DIAGNOSIS — Z23 Encounter for immunization: Secondary | ICD-10-CM

## 2014-10-06 DIAGNOSIS — Z113 Encounter for screening for infections with a predominantly sexual mode of transmission: Secondary | ICD-10-CM

## 2014-10-06 LAB — COMPLETE METABOLIC PANEL WITH GFR
ALK PHOS: 56 U/L (ref 39–117)
ALT: 49 U/L (ref 0–53)
AST: 28 U/L (ref 0–37)
Albumin: 4.7 g/dL (ref 3.5–5.2)
BUN: 12 mg/dL (ref 6–23)
CO2: 25 mEq/L (ref 19–32)
Calcium: 9.5 mg/dL (ref 8.4–10.5)
Chloride: 101 mEq/L (ref 96–112)
Creat: 1.32 mg/dL (ref 0.50–1.35)
GFR, EST AFRICAN AMERICAN: 76 mL/min
GFR, Est Non African American: 66 mL/min
Glucose, Bld: 94 mg/dL (ref 70–99)
Potassium: 4.5 mEq/L (ref 3.5–5.3)
Sodium: 137 mEq/L (ref 135–145)
TOTAL PROTEIN: 7.5 g/dL (ref 6.0–8.3)
Total Bilirubin: 0.5 mg/dL (ref 0.2–1.2)

## 2014-10-06 LAB — CBC WITH DIFFERENTIAL/PLATELET
BASOS PCT: 0 % (ref 0–1)
Basophils Absolute: 0 10*3/uL (ref 0.0–0.1)
EOS ABS: 0.1 10*3/uL (ref 0.0–0.7)
EOS PCT: 2 % (ref 0–5)
HEMATOCRIT: 43.4 % (ref 39.0–52.0)
HEMOGLOBIN: 15.1 g/dL (ref 13.0–17.0)
LYMPHS PCT: 36 % (ref 12–46)
Lymphs Abs: 1.9 10*3/uL (ref 0.7–4.0)
MCH: 31.5 pg (ref 26.0–34.0)
MCHC: 34.8 g/dL (ref 30.0–36.0)
MCV: 90.6 fL (ref 78.0–100.0)
MONOS PCT: 10 % (ref 3–12)
MPV: 11 fL (ref 8.6–12.4)
Monocytes Absolute: 0.5 10*3/uL (ref 0.1–1.0)
NEUTROS ABS: 2.8 10*3/uL (ref 1.7–7.7)
Neutrophils Relative %: 52 % (ref 43–77)
PLATELETS: 242 10*3/uL (ref 150–400)
RBC: 4.79 MIL/uL (ref 4.22–5.81)
RDW: 13.9 % (ref 11.5–15.5)
WBC: 5.4 10*3/uL (ref 4.0–10.5)

## 2014-10-06 LAB — RPR

## 2014-10-06 NOTE — Progress Notes (Signed)
Patient ID: Andrew Potter, male   DOB: 1970/08/12, 44 y.o.   MRN: [id number]       Patient ID: Andrew Potter, male   DOB: 1971/05/08, 44 y.o.   MRN: [id number]  HPI 44yo M with HIV disease, MSM risk factor. Doing well with stribild. Not missing any doses. He is working full time at [employer]. In a relationship with a partner. "being safe". Unclear if his partner has recently tested for hiv.  Outpatient Encounter Prescriptions as of 10/06/2014  Medication Sig  . elvitegravir-cobicistat-emtricitabine-tenofovir (STRIBILD) 150-150-200-300 MG TABS tablet Take 1 tablet by mouth daily with breakfast.  . Naproxen Sodium (ALEVE PO) Take 2 tablets by mouth 2 (two) times daily as needed (pain).     Patient Active Problem List   Diagnosis Date Noted  . Asymptomatic HIV infection 04/19/2014  . 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 TETANUS/TDAP  03/13/1990     Review of Systems Review of Systems  Constitutional: Negative for fever, chills, diaphoresis, activity change, appetite change, fatigue and unexpected weight change.  HENT: Negative for congestion, sore throat, rhinorrhea, sneezing, trouble swallowing and sinus pressure.  Eyes: Negative for photophobia and visual disturbance.  Respiratory: Negative for cough, chest tightness, shortness of breath, wheezing and stridor.  Cardiovascular: Negative for chest pain, palpitations and leg swelling.  Gastrointestinal: Negative for nausea, vomiting, abdominal pain, diarrhea, constipation, blood in stool, abdominal distention and anal bleeding.  Genitourinary: Negative for dysuria, hematuria, flank pain and difficulty urinating.  Musculoskeletal: Negative for myalgias, back pain, joint swelling, arthralgias and gait problem.  Skin: Negative for color change, pallor, rash and wound.  Neurological: Negative for dizziness, tremors, weakness and light-headedness.  Hematological: Negative for  adenopathy. Does not bruise/bleed easily.  Psychiatric/Behavioral: Negative for behavioral problems, confusion, sleep disturbance, dysphoric mood, decreased concentration and agitation.    Physical Exam   BP 112/68 mmHg  Pulse 59  Temp(Src) 97.7 F (36.5 C) (Oral)  Wt 127 lb (57.607 kg) Constitutional: He is oriented to person, place, and time. He appears well-developed and well-nourished. No distress.  HENT:  Mouth/Throat: Oropharynx is clear and moist. No oropharyngeal exudate.  Cardiovascular: Normal rate, regular rhythm and normal heart sounds. Exam reveals no gallop and no friction rub.  No murmur heard.  Pulmonary/Chest: Effort normal and breath sounds normal. No respiratory distress. He has no wheezes.  Abdominal: Soft. Bowel sounds are normal. He exhibits no distension. There is no tenderness.  Lymphadenopathy:  He has no cervical adenopathy.  Neurological: He is alert and oriented to person, place, and time.  Skin: Skin is warm and dry. No rash noted. No erythema.  Psychiatric: He has a normal mood and affect. His behavior is normal.    Lab Results  Component Value Date   CD4TCELL 27* 06/01/2014   Lab Results  Component Value Date   CD4TABS 610 06/01/2014   CD4TABS 610 03/31/2014   Lab Results  Component Value Date   HIV1RNAQUANT <20 06/01/2014   Lab Results  Component Value Date   HEPBSAB POS* 03/31/2014   No results found for: RPR  CBC Lab Results  Component Value Date   WBC 5.5 06/01/2014   RBC 4.42 06/01/2014   HGB 13.9 06/01/2014   HCT 40.0 06/01/2014   PLT 239 06/01/2014   MCV 90.5 06/01/2014   MCH 31.4 06/01/2014   MCHC 34.8 06/01/2014   RDW 14.3 06/01/2014   LYMPHSABS 2.3 06/01/2014  MONOABS 0.6 06/01/2014   EOSABS 0.2 06/01/2014   BASOSABS 0.0 06/01/2014   BMET Lab Results  Component Value Date   NA 141 06/01/2014   K 4.5 06/01/2014   CL 105 06/01/2014   CO2 25 06/01/2014   GLUCOSE 88 06/01/2014   BUN 10 06/01/2014   CREATININE  1.02 06/01/2014   CALCIUM 9.8 06/01/2014   GFRNONAA >89 06/01/2014   GFRAA >89 06/01/2014     Assessment and Plan   hiv disease= continue stribild, repeat labs  Health maintenance = will hep a #1, plan for 2nd in august

## 2014-10-07 LAB — T-HELPER CELL (CD4) - (RCID CLINIC ONLY)
CD4 % Helper T Cell: 28 % — ABNORMAL LOW (ref 33–55)
CD4 T Cell Abs: 580 /uL (ref 400–2700)

## 2014-10-09 LAB — HIV-1 RNA QUANT-NO REFLEX-BLD

## 2014-11-05 ENCOUNTER — Other Ambulatory Visit: Payer: Self-pay | Admitting: Internal Medicine

## 2014-12-03 ENCOUNTER — Other Ambulatory Visit: Payer: Self-pay | Admitting: Internal Medicine

## 2014-12-03 DIAGNOSIS — B20 Human immunodeficiency virus [HIV] disease: Secondary | ICD-10-CM

## 2015-01-05 ENCOUNTER — Ambulatory Visit: Payer: Self-pay | Admitting: Internal Medicine

## 2015-01-24 ENCOUNTER — Ambulatory Visit: Payer: Self-pay | Admitting: Internal Medicine

## 2015-01-25 ENCOUNTER — Ambulatory Visit (INDEPENDENT_AMBULATORY_CARE_PROVIDER_SITE_OTHER): Payer: Self-pay | Admitting: Internal Medicine

## 2015-01-25 ENCOUNTER — Encounter: Payer: Self-pay | Admitting: Internal Medicine

## 2015-01-25 VITALS — BP 116/73 | HR 61 | Temp 98.3°F | Ht 63.0 in | Wt 130.0 lb

## 2015-01-25 DIAGNOSIS — B20 Human immunodeficiency virus [HIV] disease: Secondary | ICD-10-CM

## 2015-01-25 LAB — COMPLETE METABOLIC PANEL WITH GFR
ALT: 106 U/L — ABNORMAL HIGH (ref 0–53)
AST: 66 U/L — AB (ref 0–37)
Albumin: 4.4 g/dL (ref 3.5–5.2)
Alkaline Phosphatase: 62 U/L (ref 39–117)
BUN: 11 mg/dL (ref 6–23)
CALCIUM: 9.8 mg/dL (ref 8.4–10.5)
CHLORIDE: 104 meq/L (ref 96–112)
CO2: 26 mEq/L (ref 19–32)
CREATININE: 1.28 mg/dL (ref 0.50–1.35)
GFR, EST AFRICAN AMERICAN: 79 mL/min
GFR, Est Non African American: 68 mL/min
Glucose, Bld: 78 mg/dL (ref 70–99)
POTASSIUM: 4 meq/L (ref 3.5–5.3)
SODIUM: 140 meq/L (ref 135–145)
Total Bilirubin: 0.4 mg/dL (ref 0.2–1.2)
Total Protein: 7.4 g/dL (ref 6.0–8.3)

## 2015-01-25 LAB — CBC WITH DIFFERENTIAL/PLATELET
Basophils Absolute: 0 10*3/uL (ref 0.0–0.1)
Basophils Relative: 0 % (ref 0–1)
EOS PCT: 1 % (ref 0–5)
Eosinophils Absolute: 0.1 10*3/uL (ref 0.0–0.7)
HCT: 42 % (ref 39.0–52.0)
HEMOGLOBIN: 14.5 g/dL (ref 13.0–17.0)
LYMPHS PCT: 30 % (ref 12–46)
Lymphs Abs: 2.3 10*3/uL (ref 0.7–4.0)
MCH: 31.7 pg (ref 26.0–34.0)
MCHC: 34.5 g/dL (ref 30.0–36.0)
MCV: 91.7 fL (ref 78.0–100.0)
MONOS PCT: 8 % (ref 3–12)
MPV: 11.8 fL (ref 8.6–12.4)
Monocytes Absolute: 0.6 10*3/uL (ref 0.1–1.0)
Neutro Abs: 4.6 10*3/uL (ref 1.7–7.7)
Neutrophils Relative %: 61 % (ref 43–77)
Platelets: 208 10*3/uL (ref 150–400)
RBC: 4.58 MIL/uL (ref 4.22–5.81)
RDW: 14.7 % (ref 11.5–15.5)
WBC: 7.5 10*3/uL (ref 4.0–10.5)

## 2015-01-25 MED ORDER — ELVITEG-COBIC-EMTRICIT-TENOFAF 150-150-200-10 MG PO TABS: 1.0000 | ORAL_TABLET | Freq: Every day | ORAL | Status: DC

## 2015-01-26 LAB — T-HELPER CELL (CD4) - (RCID CLINIC ONLY)
CD4 % Helper T Cell: 28 % — ABNORMAL LOW (ref 33–55)
CD4 T CELL ABS: 580 /uL (ref 400–2700)

## 2015-01-27 LAB — HIV-1 RNA QUANT-NO REFLEX-BLD

## 2015-01-27 LAB — URINE CYTOLOGY ANCILLARY ONLY
CHLAMYDIA, DNA PROBE: NEGATIVE
Neisseria Gonorrhea: NEGATIVE

## 2015-01-27 NOTE — Progress Notes (Signed)
Patient ID: Andrew GhaziMichael J Potter, male   DOB: 1971-03-29, 44 y.o.   MRN: [id number]       Patient ID: Andrew GhaziMichael J Potter, male   DOB: 1971-03-29, 44 y.o.   MRN: [id number]  HPI 44yo M with hiv disease,cd4 coutn 580/VL<20 on stribild. Doing well with adherence. In good state of health since last visit   Outpatient Encounter Prescriptions as of 01/25/2015  Medication Sig  . Naproxen Sodium (ALEVE PO) Take 2 tablets by mouth 2 (two) times daily as needed (pain).  . [DISCONTINUED] STRIBILD 150-150-200-300 MG TABS tablet TAKE 1 TABLET BY MOUTH DAILY WITH BREAKFAST  . elvitegravir-cobicistat-emtricitabine-tenofovir (GENVOYA) 150-150-200-10 MG TABS tablet Take 1 tablet by mouth daily with breakfast.   No facility-administered encounter medications on file as of 01/25/2015.     Patient Active Problem List   Diagnosis Date Noted  . Asymptomatic HIV infection 04/19/2014  . 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 TETANUS/TDAP  03/13/1990     Review of Systems 10 point ros reviewed,negative. Physical Exam   BP 116/73 mmHg  Pulse 61  Temp(Src) 98.3 F (36.8 C) (Oral)  Ht 5\' 3"  (1.6 m)  Wt 130 lb (58.968 kg)  BMI 23.03 kg/m2 Physical Exam  Constitutional: He is oriented to person, place, and time. He appears well-developed and well-nourished. No distress.  HENT:  Mouth/Throat: Oropharynx is clear and moist. No oropharyngeal exudate.  Cardiovascular: Normal rate, regular rhythm and normal heart sounds. Exam reveals no gallop and no friction rub.  No murmur heard.  Pulmonary/Chest: Effort normal and breath sounds normal. No respiratory distress. He has no wheezes.  Abdominal: Soft. Bowel sounds are normal. He exhibits no distension. There is no tenderness.  Lymphadenopathy:  He has no cervical adenopathy.  Neurological: He is alert and oriented to person, place, and time.  Skin: Skin is warm and dry. No rash noted. No erythema.  Psychiatric:  He has a normal mood and affect. His behavior is normal.    Lab Results  Component Value Date   CD4TCELL 28* 01/25/2015   Lab Results  Component Value Date   CD4TABS 580 01/25/2015   CD4TABS 580 10/06/2014   CD4TABS 610 06/01/2014   Lab Results  Component Value Date   HIV1RNAQUANT <20 01/25/2015   Lab Results  Component Value Date   HEPBSAB POS* 03/31/2014   No results found for: RPR  CBC Lab Results  Component Value Date   WBC 7.5 01/25/2015   RBC 4.58 01/25/2015   HGB 14.5 01/25/2015   HCT 42.0 01/25/2015   PLT 208 01/25/2015   MCV 91.7 01/25/2015   MCH 31.7 01/25/2015   MCHC 34.5 01/25/2015   RDW 14.7 01/25/2015   LYMPHSABS 2.3 01/25/2015   MONOABS 0.6 01/25/2015   EOSABS 0.1 01/25/2015   BASOSABS 0.0 01/25/2015   BMET Lab Results  Component Value Date   NA 140 01/25/2015   K 4.0 01/25/2015   CL 104 01/25/2015   CO2 26 01/25/2015   GLUCOSE 78 01/25/2015   BUN 11 01/25/2015   CREATININE 1.28 01/25/2015   CALCIUM 9.8 01/25/2015   GFRNONAA 68 01/25/2015   GFRAA 79 01/25/2015     Assessment and Plan   hiv disease = will change to genvoya for getting him transition to TAF off of TDF. Do not anticipate any change in viral load

## 2015-04-27 ENCOUNTER — Ambulatory Visit (INDEPENDENT_AMBULATORY_CARE_PROVIDER_SITE_OTHER): Payer: Self-pay | Admitting: Internal Medicine

## 2015-04-27 ENCOUNTER — Encounter: Payer: Self-pay | Admitting: Internal Medicine

## 2015-04-27 VITALS — BP 121/79 | HR 59 | Temp 97.7°F | Wt 136.0 lb

## 2015-04-27 DIAGNOSIS — B20 Human immunodeficiency virus [HIV] disease: Secondary | ICD-10-CM

## 2015-04-27 DIAGNOSIS — Z23 Encounter for immunization: Secondary | ICD-10-CM

## 2015-04-27 LAB — CBC WITH DIFFERENTIAL/PLATELET
BASOS ABS: 0 10*3/uL (ref 0.0–0.1)
BASOS PCT: 0 % (ref 0–1)
EOS ABS: 0.1 10*3/uL (ref 0.0–0.7)
Eosinophils Relative: 2 % (ref 0–5)
HCT: 43.5 % (ref 39.0–52.0)
Hemoglobin: 15.2 g/dL (ref 13.0–17.0)
LYMPHS ABS: 2.6 10*3/uL (ref 0.7–4.0)
Lymphocytes Relative: 44 % (ref 12–46)
MCH: 32.5 pg (ref 26.0–34.0)
MCHC: 34.9 g/dL (ref 30.0–36.0)
MCV: 93.1 fL (ref 78.0–100.0)
MPV: 11.4 fL (ref 8.6–12.4)
Monocytes Absolute: 0.5 10*3/uL (ref 0.1–1.0)
Monocytes Relative: 8 % (ref 3–12)
Neutro Abs: 2.8 10*3/uL (ref 1.7–7.7)
Neutrophils Relative %: 46 % (ref 43–77)
PLATELETS: 211 10*3/uL (ref 150–400)
RBC: 4.67 MIL/uL (ref 4.22–5.81)
RDW: 14.1 % (ref 11.5–15.5)
WBC: 6 10*3/uL (ref 4.0–10.5)

## 2015-04-27 LAB — LIPID PANEL
Cholesterol: 120 mg/dL — ABNORMAL LOW (ref 125–200)
HDL: 49 mg/dL (ref >=40)
LDL Cholesterol: 48 mg/dL (ref <130)
TRIGLYCERIDES: 113 mg/dL (ref <150)
Total CHOL/HDL Ratio: 2.4 Ratio (ref <=5.0)
VLDL: 23 mg/dL (ref <30)

## 2015-04-27 LAB — COMPLETE METABOLIC PANEL WITH GFR
ALT: 70 U/L — AB (ref 9–46)
AST: 42 U/L — ABNORMAL HIGH (ref 10–40)
Albumin: 4.5 g/dL (ref 3.6–5.1)
Alkaline Phosphatase: 57 U/L (ref 40–115)
BILIRUBIN TOTAL: 0.4 mg/dL (ref 0.2–1.2)
BUN: 11 mg/dL (ref 7–25)
CO2: 27 mmol/L (ref 20–31)
CREATININE: 1.23 mg/dL (ref 0.60–1.35)
Calcium: 9.8 mg/dL (ref 8.6–10.3)
Chloride: 103 mmol/L (ref 98–110)
GFR, Est African American: 82 mL/min (ref >=60)
GFR, Est Non African American: 71 mL/min (ref >=60)
GLUCOSE: 80 mg/dL (ref 65–99)
Potassium: 4.1 mmol/L (ref 3.5–5.3)
SODIUM: 139 mmol/L (ref 135–146)
TOTAL PROTEIN: 7.7 g/dL (ref 6.1–8.1)

## 2015-04-27 LAB — RPR

## 2015-04-27 NOTE — Progress Notes (Signed)
Patient ID: Andrew Potter, male   DOB: October 18, 1970, 44 y.o.   MRN: [id number]       Patient ID: Andrew Potter, male   DOB: 1971-02-10, 44 y.o.   MRN: [id number]  HPI 44yo M with HIV disease, CD 4 count of 580/VL<20 (june 2016) on genvoya. He reports doing well with his hiv meds. No missing doses. No recent illnesses  Outpatient Encounter Prescriptions as of 04/27/2015  Medication Sig  . elvitegravir-cobicistat-emtricitabine-tenofovir (GENVOYA) 150-150-200-10 MG TABS tablet Take 1 tablet by mouth daily with breakfast.  . Naproxen Sodium (ALEVE PO) Take 2 tablets by mouth 2 (two) times daily as needed (pain).   No facility-administered encounter medications on file as of 04/27/2015.     Patient Active Problem List   Diagnosis Date Noted  . Asymptomatic HIV infection 04/19/2014  . 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 TETANUS/TDAP  03/13/1990  . INFLUENZA VACCINE  02/27/2015     Review of Systems  Constitutional: Negative for fever, chills, diaphoresis, activity change, appetite change, fatigue and unexpected weight change.  HENT: Negative for congestion, sore throat, rhinorrhea, sneezing, trouble swallowing and sinus pressure.  Eyes: Negative for photophobia and visual disturbance.  Respiratory: Negative for cough, chest tightness, shortness of breath, wheezing and stridor.  Cardiovascular: Negative for chest pain, palpitations and leg swelling.  Gastrointestinal: Negative for nausea, vomiting, abdominal pain, diarrhea, constipation, blood in stool, abdominal distention and anal bleeding.  Genitourinary: Negative for dysuria, hematuria, flank pain and difficulty urinating.  Musculoskeletal: Negative for myalgias, back pain, joint swelling, arthralgias and gait problem.  Skin: Negative for color change, pallor, rash and wound.  Neurological: Negative for dizziness, tremors, weakness and light-headedness.  Hematological:  Negative for adenopathy. Does not bruise/bleed easily.  Psychiatric/Behavioral: Negative for behavioral problems, confusion, sleep disturbance, dysphoric mood, decreased concentration and agitation.    Physical Exam   BP 121/79 mmHg  Pulse 59  Temp(Src) 97.7 F (36.5 C) (Oral)  Wt 136 lb (61.689 kg) Physical Exam  Constitutional: He is oriented to person, place, and time. He appears well-developed and well-nourished. No distress.  HENT:  Mouth/Throat: Oropharynx is clear and moist. No oropharyngeal exudate.  Cardiovascular: Normal rate, regular rhythm and normal heart sounds. Exam reveals no gallop and no friction rub.  No murmur heard.  Pulmonary/Chest: Effort normal and breath sounds normal. No respiratory distress. He has no wheezes.  Abdominal: Soft. Bowel sounds are normal. He exhibits no distension. There is no tenderness.  Lymphadenopathy:  He has no cervical adenopathy.  Neurological: He is alert and oriented to person, place, and time.  Skin: Skin is warm and dry. No rash noted. No erythema.  Psychiatric: He has a normal mood and affect. His behavior is normal.     Lab Results  Component Value Date   CD4TCELL 28* 01/25/2015   Lab Results  Component Value Date   CD4TABS 580 01/25/2015   CD4TABS 580 10/06/2014   CD4TABS 610 06/01/2014   Lab Results  Component Value Date   HIV1RNAQUANT <20 01/25/2015   Lab Results  Component Value Date   HEPBSAB POS* 03/31/2014   No results found for: RPR  CBC Lab Results  Component Value Date   WBC 7.5 01/25/2015   RBC 4.58 01/25/2015   HGB 14.5 01/25/2015   HCT 42.0 01/25/2015   PLT 208 01/25/2015   MCV 91.7 01/25/2015   MCH 31.7 01/25/2015   MCHC 34.5 01/25/2015  RDW 14.7 01/25/2015   LYMPHSABS 2.3 01/25/2015   MONOABS 0.6 01/25/2015   EOSABS 0.1 01/25/2015   BASOSABS 0.0 01/25/2015   BMET Lab Results  Component Value Date   NA 140 01/25/2015   K 4.0 01/25/2015   CL 104 01/25/2015   CO2 26 01/25/2015    GLUCOSE 78 01/25/2015   BUN 11 01/25/2015   CREATININE 1.28 01/25/2015   CALCIUM 9.8 01/25/2015   GFRNONAA 68 01/25/2015   GFRAA 79 01/25/2015     Assessment and Plan  HIV disease = continue with current regimen, will check labs  Health maintenance = will give flu shot today

## 2015-04-28 LAB — URINALYSIS, ROUTINE W REFLEX MICROSCOPIC
Bilirubin Urine: NEGATIVE
GLUCOSE, UA: NEGATIVE
Hgb urine dipstick: NEGATIVE
Ketones, ur: NEGATIVE
Leukocytes, UA: NEGATIVE
NITRITE: NEGATIVE
Protein, ur: NEGATIVE
SPECIFIC GRAVITY, URINE: 1.014 (ref 1.001–1.035)
pH: 6.5 (ref 5.0–8.0)

## 2015-04-28 LAB — T-HELPER CELL (CD4) - (RCID CLINIC ONLY)
CD4 % Helper T Cell: 28 % — ABNORMAL LOW (ref 33–55)
CD4 T Cell Abs: 720 /uL (ref 400–2700)

## 2015-05-01 LAB — HIV-1 RNA QUANT-NO REFLEX-BLD
HIV 1 RNA Quant: <20 copies/mL (ref <20)
HIV-1 RNA Quant, Log: <1.3 {Log} (ref <1.30)

## 2015-08-22 ENCOUNTER — Telehealth: Payer: Self-pay | Admitting: *Deleted

## 2015-08-22 NOTE — Telephone Encounter (Signed)
Patient has has stomach pains, no appetite for 2 days. Denies diarrhea. Yesterday he threw up once, described it as "clear phlegm."  Has not thrown up since.  He is asking for advice on what to eat/drink now that his appetite is returning.  He does not want to overwhelm his body.   RN advised small meals as tolerated, advised patient to try foods like pasta (no sauce), broths, crackers, rice, bananas, bread.  RN advised patient to remain hydrated, to drink water, diluted juice, or ginger ale as tolerated. Patient is back at work, does not need a MD note for his absence yesterday.  He will call back if he has any issues tolerating food/fluids.  Patient declined an appointment for later in the week (none available today). Andree Coss, RN

## 2015-10-10 ENCOUNTER — Other Ambulatory Visit: Payer: Self-pay

## 2015-10-19 ENCOUNTER — Other Ambulatory Visit (INDEPENDENT_AMBULATORY_CARE_PROVIDER_SITE_OTHER): Payer: Self-pay

## 2015-10-19 ENCOUNTER — Telehealth: Payer: Self-pay | Admitting: *Deleted

## 2015-10-19 DIAGNOSIS — B20 Human immunodeficiency virus [HIV] disease: Secondary | ICD-10-CM

## 2015-10-19 LAB — CBC WITH DIFFERENTIAL/PLATELET
BASOS ABS: 0 10*3/uL (ref 0.0–0.1)
Basophils Relative: 0 % (ref 0–1)
EOS ABS: 0.1 10*3/uL (ref 0.0–0.7)
Eosinophils Relative: 2 % (ref 0–5)
HCT: 46.9 % (ref 39.0–52.0)
Hemoglobin: 16.7 g/dL (ref 13.0–17.0)
LYMPHS PCT: 29 % (ref 12–46)
Lymphs Abs: 1.4 10*3/uL (ref 0.7–4.0)
MCH: 33.1 pg (ref 26.0–34.0)
MCHC: 35.6 g/dL (ref 30.0–36.0)
MCV: 92.9 fL (ref 78.0–100.0)
MPV: 11.5 fL (ref 8.6–12.4)
Monocytes Absolute: 0.4 10*3/uL (ref 0.1–1.0)
Monocytes Relative: 9 % (ref 3–12)
NEUTROS PCT: 60 % (ref 43–77)
Neutro Abs: 2.9 10*3/uL (ref 1.7–7.7)
PLATELETS: 219 10*3/uL (ref 150–400)
RBC: 5.05 MIL/uL (ref 4.22–5.81)
RDW: 14 % (ref 11.5–15.5)
WBC: 4.8 10*3/uL (ref 4.0–10.5)

## 2015-10-19 LAB — COMPLETE METABOLIC PANEL WITH GFR
ALT: 140 U/L — ABNORMAL HIGH (ref 9–46)
AST: 115 U/L — ABNORMAL HIGH (ref 10–40)
Albumin: 4.5 g/dL (ref 3.6–5.1)
Alkaline Phosphatase: 51 U/L (ref 40–115)
BUN: 24 mg/dL (ref 7–25)
CHLORIDE: 99 mmol/L (ref 98–110)
CO2: 28 mmol/L (ref 20–31)
Calcium: 9.7 mg/dL (ref 8.6–10.3)
Creat: 1.36 mg/dL — ABNORMAL HIGH (ref 0.60–1.35)
GFR, EST AFRICAN AMERICAN: 73 mL/min (ref >=60)
GFR, EST NON AFRICAN AMERICAN: 63 mL/min (ref >=60)
Glucose, Bld: 104 mg/dL — ABNORMAL HIGH (ref 65–99)
Potassium: 4 mmol/L (ref 3.5–5.3)
Sodium: 138 mmol/L (ref 135–146)
Total Bilirubin: 0.7 mg/dL (ref 0.2–1.2)
Total Protein: 8.5 g/dL — ABNORMAL HIGH (ref 6.1–8.1)

## 2015-10-19 NOTE — Telephone Encounter (Signed)
Nausea and Vomiting x 48 hours.  No nausea today.  No diarrhea.  Abdominal muscle soreness.  Took Advil last night which made his stomach muscles feel better.  Feeling better today but fatigued and sleepy.  RN advised BRAT diet for the next 24 hours.  Explained the diet.  Pt shared that he was drinking tea and eating bananas already.  Pt intends to return to work this afternoon.

## 2015-10-20 LAB — HIV-1 RNA QUANT-NO REFLEX-BLD
HIV 1 RNA Quant: <20 copies/mL (ref <20)
HIV-1 RNA Quant, Log: <1.3 Log copies/mL (ref <1.30)

## 2015-10-20 LAB — T-HELPER CELL (CD4) - (RCID CLINIC ONLY)
CD4 % Helper T Cell: 25 % — ABNORMAL LOW (ref 33–55)
CD4 T CELL ABS: 380 /uL — AB (ref 400–2700)

## 2015-10-24 ENCOUNTER — Encounter: Payer: Self-pay | Admitting: Internal Medicine

## 2015-10-24 ENCOUNTER — Ambulatory Visit (INDEPENDENT_AMBULATORY_CARE_PROVIDER_SITE_OTHER): Payer: Self-pay | Admitting: Internal Medicine

## 2015-10-24 VITALS — BP 127/85 | HR 63 | Temp 98.2°F | Ht 63.0 in | Wt 134.0 lb

## 2015-10-24 DIAGNOSIS — R635 Abnormal weight gain: Secondary | ICD-10-CM

## 2015-10-24 DIAGNOSIS — N182 Chronic kidney disease, stage 2 (mild): Secondary | ICD-10-CM

## 2015-10-24 DIAGNOSIS — B20 Human immunodeficiency virus [HIV] disease: Secondary | ICD-10-CM

## 2015-10-24 NOTE — Progress Notes (Signed)
Patient ID: Andrew Potter, male   DOB: 1971-02-06, 45 y.o.   MRN: [id number]       Patient ID: Andrew Potter, male   DOB: 1970/12/09, 45 y.o.   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 He had 2 days of feeling poorly, n/v/d the same week he had labs taken. He is now improved from his symptoms.  He is interested in losing weight, he has cut out soda from his diet  Outpatient Encounter Prescriptions as of 10/24/2015  Medication Sig  . elvitegravir-cobicistat-emtricitabine-tenofovir (GENVOYA) 150-150-200-10 MG TABS tablet Take 1 tablet by mouth daily with breakfast.  . Naproxen Sodium (ALEVE PO) Take 2 tablets by mouth 2 (two) times daily as needed (pain).   No facility-administered encounter medications on file as of 10/24/2015.     Patient Active Problem List   Diagnosis Date Noted  . Asymptomatic HIV infection (HCC) 04/19/2014  . 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 TETANUS/TDAP  03/13/1990     Review of Systems Review of Systems  Constitutional: Negative for fever, chills, diaphoresis, activity change, appetite change, fatigue and unexpected weight change.  HENT: Negative for congestion, sore throat, rhinorrhea, sneezing, trouble swallowing and sinus pressure.  Eyes: Negative for photophobia and visual disturbance.  Respiratory: Negative for cough, chest tightness, shortness of breath, wheezing and stridor.  Cardiovascular: Negative for chest pain, palpitations and leg swelling.  Gastrointestinal: Negative for nausea, vomiting, abdominal pain, diarrhea, constipation, blood in stool, abdominal distention and anal bleeding.  Genitourinary: Negative for dysuria, hematuria, flank pain and difficulty urinating.  Musculoskeletal: Negative for myalgias, back pain, joint swelling, arthralgias and gait problem.  Skin: abrasion to scalp from ceiling fan last week Neurological: Negative for dizziness,  tremors, weakness and light-headedness.  Hematological: Negative for adenopathy. Does not bruise/bleed easily.  Psychiatric/Behavioral: Negative for behavioral problems, confusion, sleep disturbance, dysphoric mood, decreased concentration and agitation.    Physical Exam   BP 127/85 mmHg  Pulse 63  Temp(Src) 98.2 F (36.8 C) (Oral)  Ht  (1.6 m)  Wt 134 lb (60.782 kg)  BMI 23.74 kg/m2 Physical Exam  Constitutional: He is oriented to person, place, and time. He appears well-developed and well-nourished. No distress.  HENT:  Mouth/Throat: Oropharynx is clear and moist. No oropharyngeal exudate.  Cardiovascular: Normal rate, regular rhythm and normal heart sounds. Exam reveals no gallop and no friction rub.  No murmur heard.  Pulmonary/Chest: Effort normal and breath sounds normal. No respiratory distress. He has no wheezes.  Abdominal: Soft. Bowel sounds are normal. He exhibits no distension. There is no tenderness.  Lymphadenopathy:  He has no cervical adenopathy.  Neurological: He is alert and oriented to person, place, and time.  Skin: Skin is warm and dry. No rash noted. No erythema.  Psychiatric: He has a normal mood and affect. His behavior is normal.    Lab Results  Component Value Date   CD4TCELL 25* 10/19/2015   Lab Results  Component Value Date   CD4TABS 380* 10/19/2015   CD4TABS 720 04/27/2015   CD4TABS 580 01/25/2015   Lab Results  Component Value Date   HIV1RNAQUANT <20 10/19/2015   Lab Results  Component Value Date   HEPBSAB POS* 03/31/2014   No results found for: RPR  CBC Lab Results  Component Value Date   WBC 4.8 10/19/2015   RBC 5.05 10/19/2015   HGB 16.7 10/19/2015  HCT 46.9 10/19/2015   PLT 219 10/19/2015   MCV 92.9 10/19/2015   MCH 33.1 10/19/2015   MCHC 35.6 10/19/2015   RDW 14.0 10/19/2015   LYMPHSABS 1.4 10/19/2015   MONOABS 0.4 10/19/2015   EOSABS 0.1 10/19/2015   BASOSABS 0.0 10/19/2015   BMET Lab Results  Component  Value Date   NA 138 10/19/2015   K 4.0 10/19/2015   CL 99 10/19/2015   CO2 28 10/19/2015   GLUCOSE 104* 10/19/2015   BUN 24 10/19/2015   CREATININE 1.36* 10/19/2015   CALCIUM 9.7 10/19/2015   GFRNONAA 63 10/19/2015   GFRAA 73 10/19/2015     Assessment and Plan hiv disease =well controlled. Will repeat labs in 3-6 mo.  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 Recommend to keep hydrated  Weight gain = recommended increase activity, pedometer. Less sugar in nutritional intake

## 2015-11-05 IMAGING — CR DG SHOULDER 2+V*L*
3 series · 3 of 3 positions shown · non-contrast
Comparison: Left shoulder radiographs performed 05/01/2007

CLINICAL DATA: Injury to left shoulder while moving heavy boxes;
left lateral shoulder pain.

EXAM:
LEFT SHOULDER - 2+ VIEW

[w shoulder ap internal left]
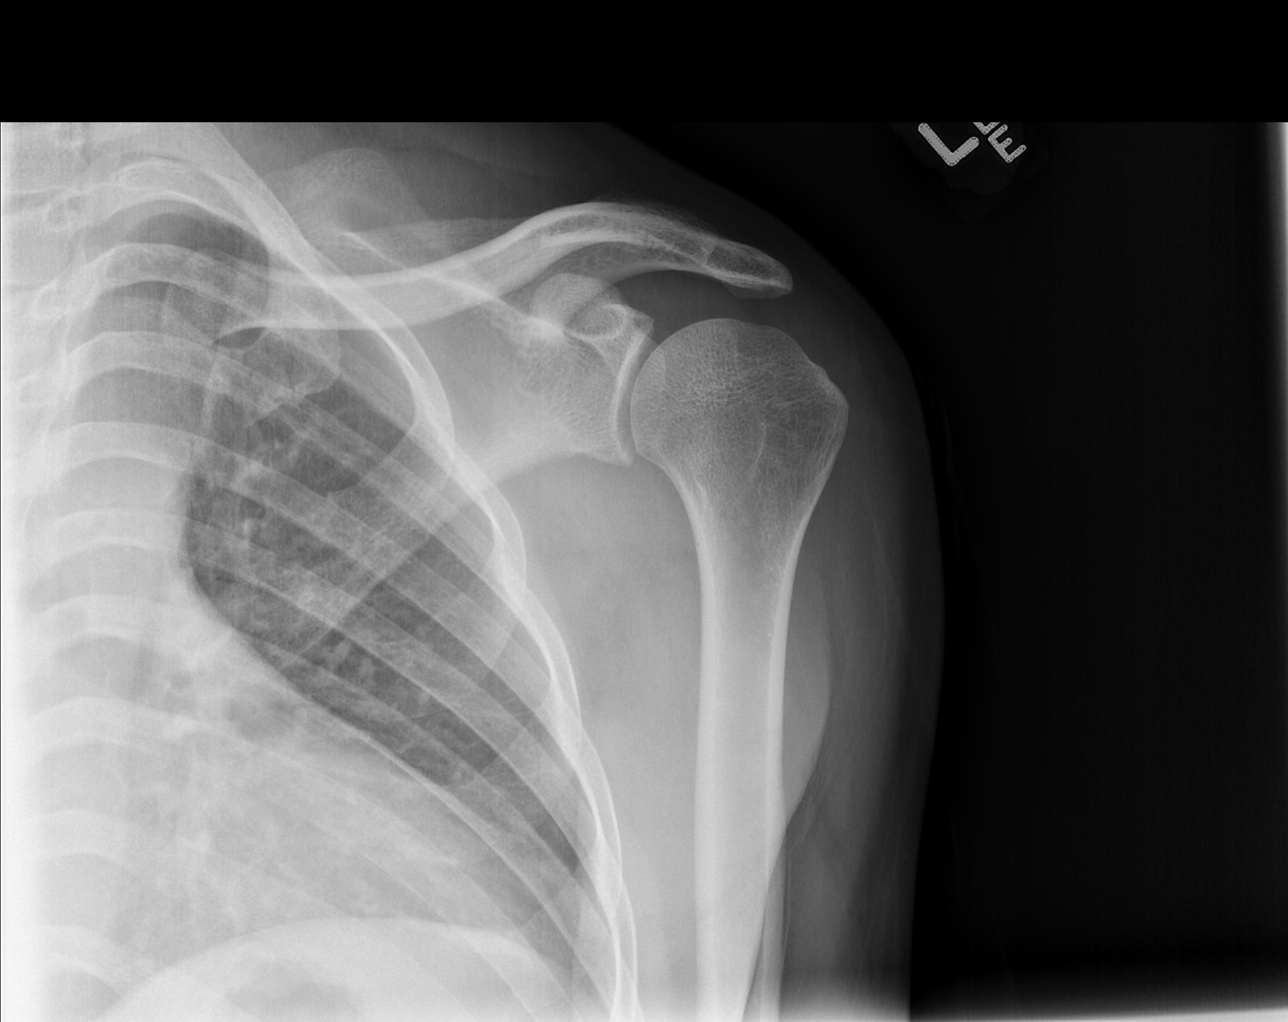

[w shoulder y view left]
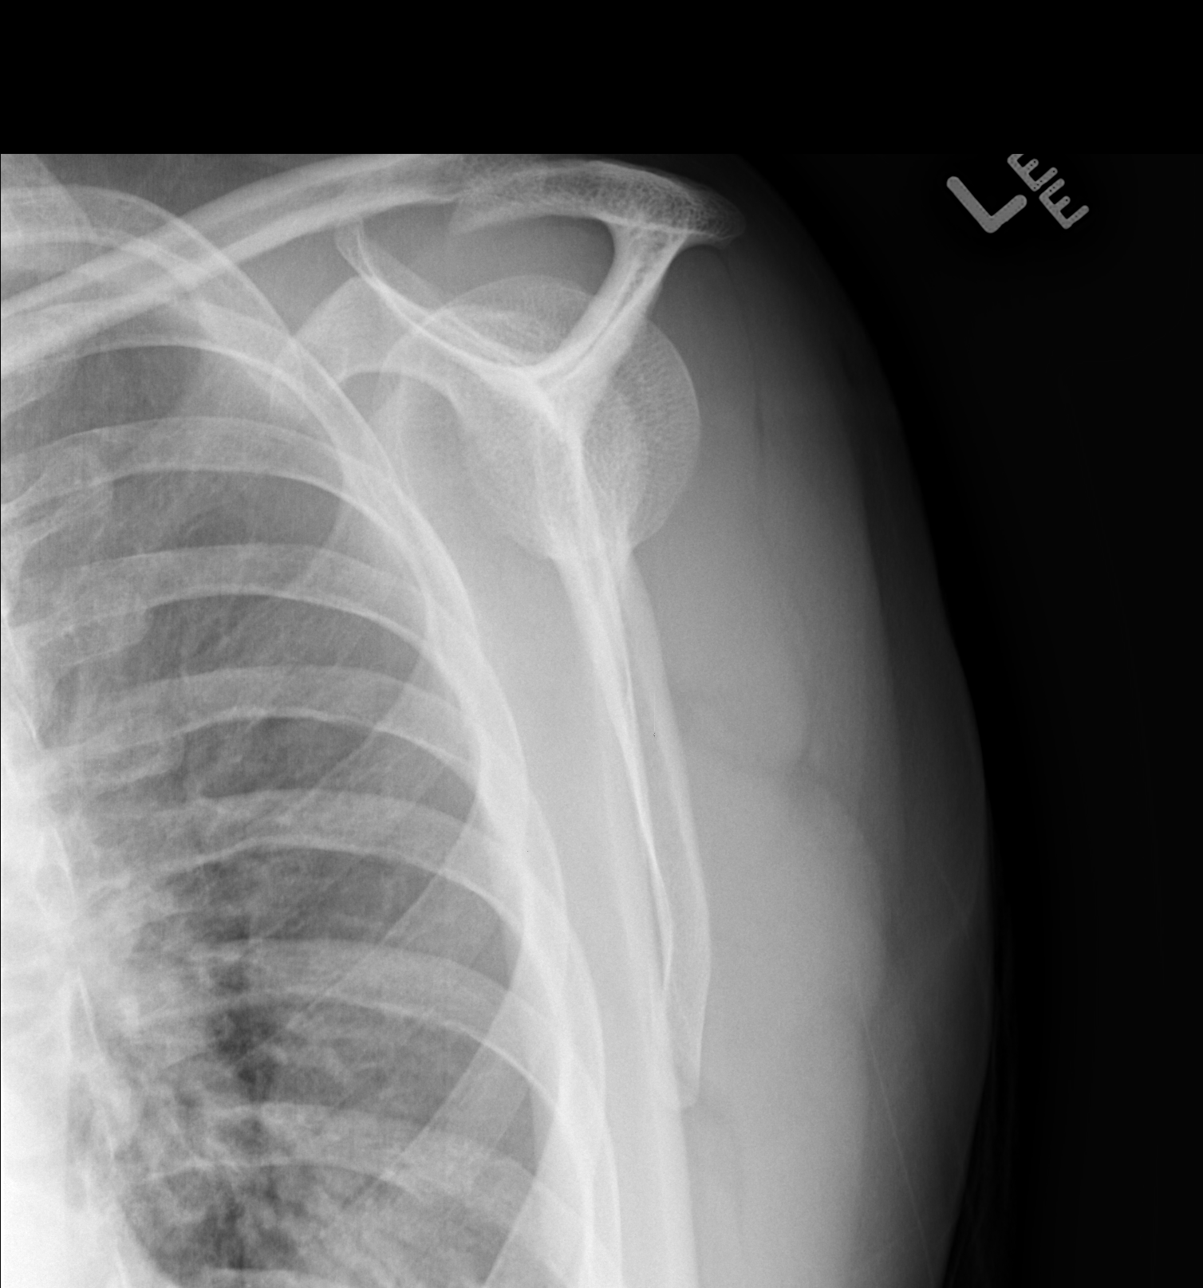

[x shoulder axillary left]
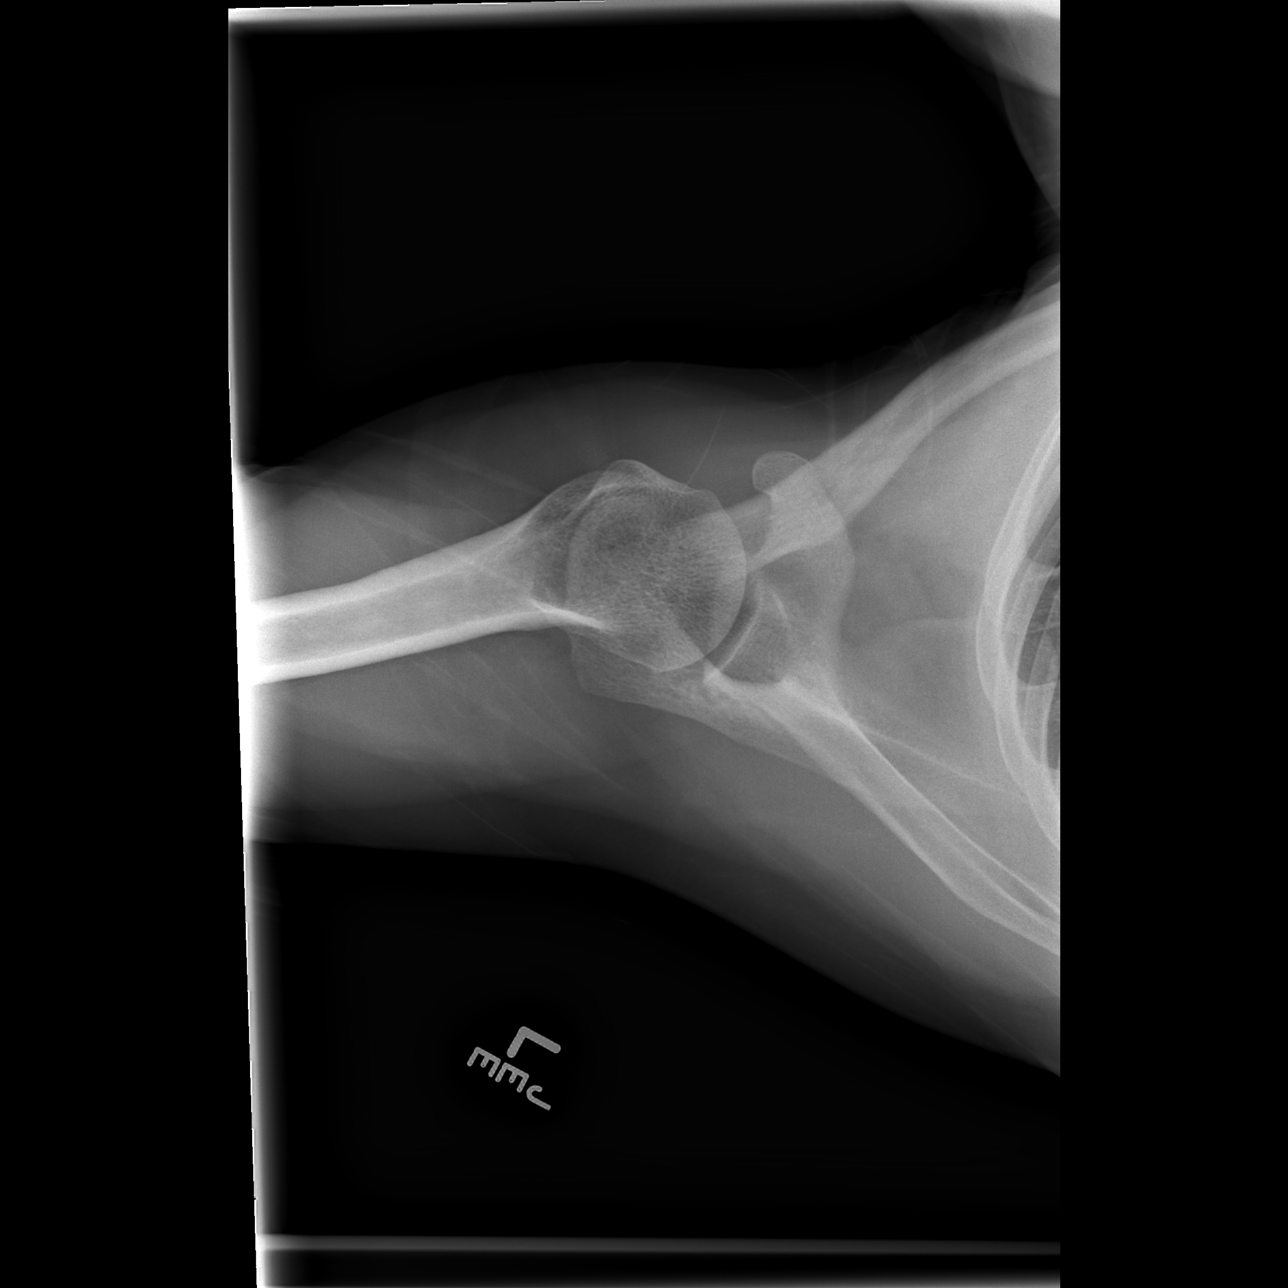

[3 of 3 positions shown; findings below may reference images not displayed]

FINDINGS: There is no evidence of fracture or dislocation. The left humeral
head is seated within the glenoid fossa. The acromioclavicular joint
is unremarkable in appearance. No significant soft tissue
abnormalities are seen. The visualized portions of the left lung are
clear.
IMPRESSION: No evidence of fracture or dislocation.

## 2015-12-05 ENCOUNTER — Encounter: Payer: Self-pay | Admitting: Internal Medicine

## 2016-01-03 ENCOUNTER — Ambulatory Visit (INDEPENDENT_AMBULATORY_CARE_PROVIDER_SITE_OTHER): Payer: Self-pay | Admitting: Internal Medicine

## 2016-01-03 ENCOUNTER — Encounter: Payer: Self-pay | Admitting: Internal Medicine

## 2016-01-03 VITALS — BP 125/81 | HR 60 | Temp 98.1°F | Ht 63.0 in | Wt 133.0 lb

## 2016-01-03 DIAGNOSIS — N182 Chronic kidney disease, stage 2 (mild): Secondary | ICD-10-CM

## 2016-01-03 DIAGNOSIS — B20 Human immunodeficiency virus [HIV] disease: Secondary | ICD-10-CM

## 2016-01-03 DIAGNOSIS — J029 Acute pharyngitis, unspecified: Secondary | ICD-10-CM

## 2016-01-03 DIAGNOSIS — Z23 Encounter for immunization: Secondary | ICD-10-CM

## 2016-01-03 NOTE — Progress Notes (Signed)
Patient ID: Andrew Potter, male   DOB: [birth year], 45 y.o.   MRN: [id number]       Patient ID: Andrew Potter, male   DOB: 1971/03/26, 45 y.o.   MRN: [id number]  HPI Andrew Potter is a 45yo M with HIV disease, well controlled, CD 4 count of 380/VL 25( march 2017) he reports good adherence, no missing doses since last visit. He states that apart from having a bout of viral gastroenteritis back in march, he has been in good state of health. He states occasionally has sore throat, though he reports using mouth rinse 3 times per day.  Soc hx: no sexual encounters since last visit  Outpatient Encounter Prescriptions as of 01/03/2016  Medication Sig  . elvitegravir-cobicistat-emtricitabine-tenofovir (GENVOYA) 150-150-200-10 MG TABS tablet Take 1 tablet by mouth daily with breakfast.  . Naproxen Sodium (ALEVE PO) Take 2 tablets by mouth 2 (two) times daily as needed (pain).   No facility-administered encounter medications on file as of 01/03/2016.     Patient Active Problem List   Diagnosis Date Noted  . Asymptomatic HIV infection (HCC) 04/19/2014  . 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 TETANUS/TDAP  03/13/1990     Review of Systems Review of Systems  Constitutional: Negative for fever, chills, diaphoresis, activity change, appetite change, fatigue and unexpected weight change.  HENT: Negative for congestion, sore throat, rhinorrhea, sneezing, trouble swallowing and sinus pressure.  Eyes: Negative for photophobia and visual disturbance.  Respiratory: Negative for cough, chest tightness, shortness of breath, wheezing and stridor.  Cardiovascular: Negative for chest pain, palpitations and leg swelling.  Gastrointestinal: Negative for nausea, vomiting, abdominal pain, diarrhea, constipation, blood in stool, abdominal distention and anal bleeding.  Genitourinary: Negative for dysuria, hematuria, flank pain and difficulty urinating.    Musculoskeletal: Negative for myalgias, back pain, joint swelling, arthralgias and gait problem.  Skin: Negative for color change, pallor, rash and wound.  Neurological: Negative for dizziness, tremors, weakness and light-headedness.  Hematological: Negative for adenopathy. Does not bruise/bleed easily.  Psychiatric/Behavioral: Negative for behavioral problems, confusion, sleep disturbance, dysphoric mood, decreased concentration and agitation.    Physical Exam   BP 125/81 mmHg  Pulse 60  Temp(Src) 98.1 F (36.7 C) (Oral)  Ht  (1.6 m)  Wt 133 lb (60.328 kg)  BMI 23.57 kg/m2 Physical Exam  Constitutional: He is oriented to person, place, and time. He appears well-developed and well-nourished. No distress.  HENT:  Mouth/Throat: Oropharynx is clear and moist. No oropharyngeal exudate.  Cardiovascular: Normal rate, regular rhythm and normal heart sounds. Exam reveals no gallop and no friction rub.  No murmur heard.  Pulmonary/Chest: Effort normal and breath sounds normal. No respiratory distress. He has no wheezes.  Abdominal: Soft. Bowel sounds are normal. He exhibits no distension. There is no tenderness.  Lymphadenopathy:  He has no cervical adenopathy.  Neurological: He is alert and oriented to person, place, and time.  Skin: Skin is warm and dry. No rash noted. No erythema.  Psychiatric: He has a normal mood and affect. His behavior is normal.    Lab Results  Component Value Date   CD4TCELL 25* 10/19/2015   Lab Results  Component Value Date   CD4TABS 380* 10/19/2015   CD4TABS 720 04/27/2015   CD4TABS 580 01/25/2015   Lab Results  Component Value Date   HIV1RNAQUANT <20 10/19/2015   Lab Results  Component Value Date   HEPBSAB POS* 03/31/2014  No results found for: RPR  CBC Lab Results  Component Value Date   WBC 4.8 10/19/2015   RBC 5.05 10/19/2015   HGB 16.7 10/19/2015   HCT 46.9 10/19/2015   PLT 219 10/19/2015   MCV 92.9 10/19/2015   MCH 33.1  10/19/2015   MCHC 35.6 10/19/2015   RDW 14.0 10/19/2015   LYMPHSABS 1.4 10/19/2015   MONOABS 0.4 10/19/2015   EOSABS 0.1 10/19/2015   BASOSABS 0.0 10/19/2015   BMET Lab Results  Component Value Date   NA 138 10/19/2015   K 4.0 10/19/2015   CL 99 10/19/2015   CO2 28 10/19/2015   GLUCOSE 104* 10/19/2015   BUN 24 10/19/2015   CREATININE 1.36* 10/19/2015   CALCIUM 9.7 10/19/2015   GFRNONAA 63 10/19/2015   GFRAA 73 10/19/2015     Assessment and Plan  hiv disease = last cd 4 count lower than his baseline which is thought to be due to acute illness. He remains virologically controlled. Will check cd 4 ct and viral load at this visit. Anticipate to continue with genvoya  Sore throat = physical exam is unremarkable. No viral prodrome. Will ask him to reduce mouth rinse to just daily and wonder if he is having chemical irritation  ckd2 = though to be mildly elevated to due cobi. Will continue to monitor. Use nsaids sparingly  Health promotion = will give hep a #2 vaccination today

## 2016-01-04 LAB — T-HELPER CELL (CD4) - (RCID CLINIC ONLY)
CD4 % Helper T Cell: 29 % — ABNORMAL LOW (ref 33–55)
CD4 T CELL ABS: 930 /uL (ref 400–2700)

## 2016-01-04 LAB — BASIC METABOLIC PANEL WITH GFR
BUN: 15 mg/dL (ref 7–25)
CALCIUM: 9.6 mg/dL (ref 8.6–10.3)
CO2: 22 mmol/L (ref 20–31)
CREATININE: 1.17 mg/dL (ref 0.60–1.35)
Chloride: 101 mmol/L (ref 98–110)
GFR, EST AFRICAN AMERICAN: 87 mL/min (ref >=60)
GFR, EST NON AFRICAN AMERICAN: 75 mL/min (ref >=60)
GLUCOSE: 94 mg/dL (ref 65–99)
Potassium: 4.4 mmol/L (ref 3.5–5.3)
SODIUM: 137 mmol/L (ref 135–146)

## 2016-01-05 LAB — HIV-1 RNA QUANT-NO REFLEX-BLD: HIV-1 RNA Quant, Log: <1.3 Log copies/mL (ref <1.30)

## 2016-01-08 ENCOUNTER — Ambulatory Visit: Payer: Self-pay | Admitting: Internal Medicine

## 2016-02-05 ENCOUNTER — Other Ambulatory Visit: Payer: Self-pay | Admitting: Internal Medicine

## 2016-02-05 DIAGNOSIS — B20 Human immunodeficiency virus [HIV] disease: Secondary | ICD-10-CM

## 2016-02-09 ENCOUNTER — Other Ambulatory Visit: Payer: Self-pay | Admitting: Internal Medicine

## 2016-05-06 ENCOUNTER — Ambulatory Visit (INDEPENDENT_AMBULATORY_CARE_PROVIDER_SITE_OTHER): Payer: Self-pay | Admitting: Internal Medicine

## 2016-05-06 ENCOUNTER — Encounter: Payer: Self-pay | Admitting: Internal Medicine

## 2016-05-06 VITALS — BP 134/79 | HR 71 | Temp 98.6°F | Ht 63.0 in | Wt 130.0 lb

## 2016-05-06 DIAGNOSIS — Z23 Encounter for immunization: Secondary | ICD-10-CM

## 2016-05-06 DIAGNOSIS — B20 Human immunodeficiency virus [HIV] disease: Secondary | ICD-10-CM

## 2016-05-06 LAB — CBC WITH DIFFERENTIAL/PLATELET
BASOS PCT: 0 %
Basophils Absolute: 0 cells/uL (ref 0–200)
EOS PCT: 1 %
Eosinophils Absolute: 50 cells/uL (ref 15–500)
HEMATOCRIT: 44.7 % (ref 38.5–50.0)
HEMOGLOBIN: 15.3 g/dL (ref 13.2–17.1)
LYMPHS ABS: 2050 {cells}/uL (ref 850–3900)
Lymphocytes Relative: 41 %
MCH: 32.1 pg (ref 27.0–33.0)
MCHC: 34.2 g/dL (ref 32.0–36.0)
MCV: 93.7 fL (ref 80.0–100.0)
MONO ABS: 300 {cells}/uL (ref 200–950)
MPV: 11.8 fL (ref 7.5–12.5)
Monocytes Relative: 6 %
NEUTROS ABS: 2600 {cells}/uL (ref 1500–7800)
Neutrophils Relative %: 52 %
Platelets: 222 10*3/uL (ref 140–400)
RBC: 4.77 MIL/uL (ref 4.20–5.80)
RDW: 14.1 % (ref 11.0–15.0)
WBC: 5 10*3/uL (ref 3.8–10.8)

## 2016-05-06 LAB — LIPID PANEL
CHOL/HDL RATIO: 1.8 ratio (ref <=5.0)
Cholesterol: 116 mg/dL — ABNORMAL LOW (ref 125–200)
HDL: 64 mg/dL (ref >=40)
LDL CALC: 39 mg/dL (ref <130)
Triglycerides: 63 mg/dL (ref <150)
VLDL: 13 mg/dL (ref <30)

## 2016-05-06 LAB — COMPLETE METABOLIC PANEL WITH GFR
ALBUMIN: 4.5 g/dL (ref 3.6–5.1)
ALK PHOS: 46 U/L (ref 40–115)
ALT: 35 U/L (ref 9–46)
AST: 29 U/L (ref 10–40)
BUN: 14 mg/dL (ref 7–25)
CALCIUM: 10 mg/dL (ref 8.6–10.3)
CO2: 27 mmol/L (ref 20–31)
CREATININE: 1.33 mg/dL (ref 0.60–1.35)
Chloride: 101 mmol/L (ref 98–110)
GFR, Est African American: 74 mL/min (ref >=60)
GFR, Est Non African American: 64 mL/min (ref >=60)
GLUCOSE: 112 mg/dL — AB (ref 65–99)
POTASSIUM: 4.5 mmol/L (ref 3.5–5.3)
SODIUM: 138 mmol/L (ref 135–146)
Total Bilirubin: 0.6 mg/dL (ref 0.2–1.2)
Total Protein: 7.6 g/dL (ref 6.1–8.1)

## 2016-05-06 NOTE — Progress Notes (Signed)
RFV: hiv follow up  Patient ID: Andrew Potter, male   DOB: January 26, 1971, 45 y.o.   MRN: [id number]  HPI 45yo M with HIV disease, Cd 4 count of 930/VL<20 on genvoya. Doing well with miedication. He is not presently sexually active. No recent unprotected sex  Outpatient Encounter Prescriptions as of 05/06/2016  Medication Sig  . GENVOYA 150-150-200-10 MG TABS tablet TAKE 1 TABLET BY MOUTH DAILY WITH BREAKFAST  . Naproxen Sodium (ALEVE PO) Take 2 tablets by mouth 2 (two) times daily as needed (pain).  . [DISCONTINUED] GENVOYA 150-150-200-10 MG TABS tablet TAKE 1 TABLET BY MOUTH DAILY WITH BREAKFAST   No facility-administered encounter medications on file as of 05/06/2016.      Patient Active Problem List   Diagnosis Date Noted  . Asymptomatic HIV infection (HCC) 04/19/2014  . 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 TETANUS/TDAP  03/13/1990  . INFLUENZA VACCINE  02/27/2016     Review of Systems 10 point ros is negative Physical Exam   BP 134/79   Pulse 71   Temp 98.6 F (37 C) (Oral)   Ht 5\' 3"  (1.6 m)   Wt 59 kg (130 lb)   BMI 23.03 kg/m  Physical Exam  Constitutional: He is oriented to person, place, and time. He appears well-developed and well-nourished. No distress.  HENT:  Mouth/Throat: Oropharynx is clear and moist. No oropharyngeal exudate.  Cardiovascular: Normal rate, regular rhythm and normal heart sounds. Exam reveals no gallop and no friction rub.  No murmur heard.  Pulmonary/Chest: Effort normal and breath sounds normal. No respiratory distress. He has no wheezes.  Abdominal: Soft. Bowel sounds are normal. He exhibits no distension. There is no tenderness.  Lymphadenopathy:  He has no cervical adenopathy.  Neurological: He is alert and oriented to person, place, and time.  Skin: Skin is warm and dry. No rash noted. No erythema.  Psychiatric: He has a normal mood and affect. His behavior is normal.     Lab Results  Component Value Date   CD4TCELL 29 (L) 01/03/2016   Lab Results  Component Value Date   CD4TABS 930 01/03/2016   CD4TABS 380 (L) 10/19/2015   CD4TABS 720 04/27/2015   Lab Results  Component Value Date   HIV1RNAQUANT <20 01/03/2016   Lab Results  Component Value Date   HEPBSAB POS (A) 03/31/2014   No results found for: RPR  CBC Lab Results  Component Value Date   WBC 4.8 10/19/2015   RBC 5.05 10/19/2015   HGB 16.7 10/19/2015   HCT 46.9 10/19/2015   PLT 219 10/19/2015   MCV 92.9 10/19/2015   MCH 33.1 10/19/2015   MCHC 35.6 10/19/2015   RDW 14.0 10/19/2015   LYMPHSABS 1.4 10/19/2015   MONOABS 0.4 10/19/2015   EOSABS 0.1 10/19/2015   BASOSABS 0.0 10/19/2015   BMET Lab Results  Component Value Date   NA 137 01/03/2016   K 4.4 01/03/2016   CL 101 01/03/2016   CO2 22 01/03/2016   GLUCOSE 94 01/03/2016   BUN 15 01/03/2016   CREATININE 1.17 01/03/2016   CALCIUM 9.6 01/03/2016   GFRNONAA 75 01/03/2016   GFRAA 87 01/03/2016     Assessment:  hiv disease = will get labs, plan to continue on genvoya  Weight loss =doing well with exercise nad diet modification. 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

## 2016-05-07 LAB — T-HELPER CELL (CD4) - (RCID CLINIC ONLY)
CD4 T CELL ABS: 640 /uL (ref 400–2700)
CD4 T CELL HELPER: 30 % — AB (ref 33–55)

## 2016-05-07 LAB — HIV-1 RNA QUANT-NO REFLEX-BLD
HIV 1 RNA Quant: <20 copies/mL (ref <20)
HIV-1 RNA Quant, Log: <1.3 Log copies/mL (ref <1.30)

## 2016-05-07 LAB — URINE CYTOLOGY ANCILLARY ONLY
Chlamydia: NEGATIVE
Neisseria Gonorrhea: NEGATIVE

## 2016-05-07 LAB — FLUORESCENT TREPONEMAL AB(FTA)-IGG-BLD: FLUORESCENT TREPONEMAL ABS: REACTIVE — AB

## 2016-05-07 LAB — RPR: RPR: REACTIVE — AB

## 2016-05-07 LAB — RPR TITER: RPR Titer: 1:1 {titer}

## 2016-05-13 ENCOUNTER — Telehealth: Payer: Self-pay | Admitting: *Deleted

## 2016-05-13 NOTE — Telephone Encounter (Signed)
Patient coming for treatment x1 tomorrow AM, 05/14/16 at 0900.

## 2016-05-13 NOTE — Telephone Encounter (Signed)
Patient received phone call from [facility] - no reason given,-.  RN reviewed RPR results with Dr. Drue SecondSnider.  Received phone order from Dr. Ivar Drape. Snider for the patient to receive 2.4 Million units Bicillin LA IM once for treatment of syphilis.  Left patient a message to call [facility] to schedule appt for treatment.

## 2016-05-14 ENCOUNTER — Other Ambulatory Visit: Payer: Self-pay | Admitting: *Deleted

## 2016-05-14 ENCOUNTER — Ambulatory Visit (INDEPENDENT_AMBULATORY_CARE_PROVIDER_SITE_OTHER): Payer: Self-pay | Admitting: *Deleted

## 2016-05-14 DIAGNOSIS — A539 Syphilis, unspecified: Secondary | ICD-10-CM

## 2016-05-14 MED ORDER — ELVITEG-COBIC-EMTRICIT-TENOFAF 150-150-200-10 MG PO TABS: 1.0000 | ORAL_TABLET | Freq: Every day | ORAL | 3 refills | Status: DC

## 2016-05-14 MED ORDER — PENICILLIN G BENZATHINE 1200000 UNIT/2ML IM SUSP
1.2000 10*6.[IU] | Freq: Once | INTRAMUSCULAR | Status: AC
  Administered 2016-05-14: 1.2 10*6.[IU] via INTRAMUSCULAR

## 2016-06-04 ENCOUNTER — Encounter: Payer: Self-pay | Admitting: Internal Medicine

## 2016-08-28 ENCOUNTER — Ambulatory Visit: Payer: Self-pay

## 2016-09-02 ENCOUNTER — Encounter: Payer: Self-pay | Admitting: Internal Medicine

## 2016-09-19 ENCOUNTER — Other Ambulatory Visit: Payer: Self-pay | Admitting: Internal Medicine

## 2016-09-19 DIAGNOSIS — B20 Human immunodeficiency virus [HIV] disease: Secondary | ICD-10-CM

## 2016-11-26 ENCOUNTER — Encounter: Payer: Self-pay | Admitting: Internal Medicine

## 2016-11-26 ENCOUNTER — Ambulatory Visit (INDEPENDENT_AMBULATORY_CARE_PROVIDER_SITE_OTHER): Payer: Self-pay | Admitting: Internal Medicine

## 2016-11-26 VITALS — BP 144/93 | HR 65 | Temp 98.3°F | Ht 63.0 in | Wt 134.0 lb

## 2016-11-26 DIAGNOSIS — Z79899 Other long term (current) drug therapy: Secondary | ICD-10-CM

## 2016-11-26 DIAGNOSIS — R030 Elevated blood-pressure reading, without diagnosis of hypertension: Secondary | ICD-10-CM

## 2016-11-26 DIAGNOSIS — N182 Chronic kidney disease, stage 2 (mild): Secondary | ICD-10-CM

## 2016-11-26 DIAGNOSIS — B20 Human immunodeficiency virus [HIV] disease: Secondary | ICD-10-CM

## 2016-11-26 DIAGNOSIS — Z113 Encounter for screening for infections with a predominantly sexual mode of transmission: Secondary | ICD-10-CM

## 2016-11-26 LAB — CBC WITH DIFFERENTIAL/PLATELET
Basophils Absolute: 0 cells/uL (ref 0–200)
Basophils Relative: 0 %
EOS ABS: 56 {cells}/uL (ref 15–500)
Eosinophils Relative: 1 %
HEMATOCRIT: 45.8 % (ref 38.5–50.0)
Hemoglobin: 15.6 g/dL (ref 13.2–17.1)
LYMPHS PCT: 38 %
Lymphs Abs: 2128 cells/uL (ref 850–3900)
MCH: 32.2 pg (ref 27.0–33.0)
MCHC: 34.1 g/dL (ref 32.0–36.0)
MCV: 94.6 fL (ref 80.0–100.0)
MONO ABS: 504 {cells}/uL (ref 200–950)
MPV: 11.2 fL (ref 7.5–12.5)
Monocytes Relative: 9 %
Neutro Abs: 2912 cells/uL (ref 1500–7800)
Neutrophils Relative %: 52 %
Platelets: 221 10*3/uL (ref 140–400)
RBC: 4.84 MIL/uL (ref 4.20–5.80)
RDW: 14.2 % (ref 11.0–15.0)
WBC: 5.6 10*3/uL (ref 3.8–10.8)

## 2016-11-26 LAB — COMPLETE METABOLIC PANEL WITH GFR
ALBUMIN: 4.8 g/dL (ref 3.6–5.1)
ALK PHOS: 50 U/L (ref 40–115)
ALT: 47 U/L — AB (ref 9–46)
AST: 32 U/L (ref 10–40)
BUN: 11 mg/dL (ref 7–25)
CHLORIDE: 105 mmol/L (ref 98–110)
CO2: 25 mmol/L (ref 20–31)
CREATININE: 1.27 mg/dL (ref 0.60–1.35)
Calcium: 9.9 mg/dL (ref 8.6–10.3)
GFR, EST AFRICAN AMERICAN: 78 mL/min (ref >=60)
GFR, Est Non African American: 68 mL/min (ref >=60)
Glucose, Bld: 87 mg/dL (ref 65–99)
Potassium: 4.5 mmol/L (ref 3.5–5.3)
Sodium: 140 mmol/L (ref 135–146)
Total Bilirubin: 0.4 mg/dL (ref 0.2–1.2)
Total Protein: 8 g/dL (ref 6.1–8.1)

## 2016-11-26 LAB — HEPATITIS C ANTIBODY: HCV Ab: NEGATIVE

## 2016-11-26 LAB — LIPID PANEL
CHOL/HDL RATIO: 2.1 ratio (ref <5.0)
Cholesterol: 113 mg/dL (ref <200)
HDL: 53 mg/dL (ref >40)
LDL Cholesterol: 48 mg/dL (ref <100)
TRIGLYCERIDES: 61 mg/dL (ref <150)
VLDL: 12 mg/dL (ref <30)

## 2016-11-26 NOTE — Progress Notes (Signed)
Patient ID: Andrew Potter, male   DOB: 09-Sep-1970, 46 y.o.   MRN: [id number]  HPI [name] is a 46yo M with HIV disease, Cd 4 count of  640/VL<20, doing well with genvoya. No health issues Outpatient Encounter Prescriptions as of 11/26/2016  Medication Sig  . elvitegravir-cobicistat-emtricitabine-tenofovir (GENVOYA) 150-150-200-10 MG TABS tablet Take 1 tablet by mouth daily with breakfast.  . Naproxen Sodium (ALEVE PO) Take 2 tablets by mouth 2 (two) times daily as needed (pain).  . [DISCONTINUED] GENVOYA 150-150-200-10 MG TABS tablet TAKE 1 TABLET BY MOUTH DAILY WITH BREAKFAST   No facility-administered encounter medications on file as of 11/26/2016.      Patient Active Problem List   Diagnosis Date Noted  . Asymptomatic HIV infection (HCC) 04/19/2014  . 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 TETANUS/TDAP  03/13/1990     Review of Systems Review of Systems  Constitutional: Negative for fever, chills, diaphoresis, activity change, appetite change, fatigue and unexpected weight change.  HENT: Negative for congestion, sore throat, rhinorrhea, sneezing, trouble swallowing and sinus pressure.  Eyes: Negative for photophobia and visual disturbance.  Respiratory: Negative for cough, chest tightness, shortness of breath, wheezing and stridor.  Cardiovascular: Negative for chest pain, palpitations and leg swelling.  Gastrointestinal: Negative for nausea, vomiting, abdominal pain, diarrhea, constipation, blood in stool, abdominal distention and anal bleeding.  Genitourinary: Negative for dysuria, hematuria, flank pain and difficulty urinating.  Musculoskeletal: Negative for myalgias, back pain, joint swelling, arthralgias and gait problem.  Skin: Negative for color change, pallor, rash and wound.  Neurological: Negative for dizziness, tremors, weakness and light-headedness.  Hematological: Negative for adenopathy. Does not  bruise/bleed easily.  Psychiatric/Behavioral: Negative for behavioral problems, confusion, sleep disturbance, dysphoric mood, decreased concentration and agitation.    Physical Exam   BP (!) 144/93   Pulse 65   Temp 98.3 F (36.8 C) (Oral)   Ht  (1.6 m)   Wt 134 lb (60.8 kg)   BMI 23.74 kg/m   Physical Exam  Constitutional: He is oriented to person, place, and time. He appears well-developed and well-nourished. No distress.  HENT:  Mouth/Throat: Oropharynx is clear and moist. No oropharyngeal exudate.  Cardiovascular: Normal rate, regular rhythm and normal heart sounds. Exam reveals no gallop and no friction rub.  No murmur heard.  Pulmonary/Chest: Effort normal and breath sounds normal. No respiratory distress. He has no wheezes.  Lymphadenopathy:  He has no cervical adenopathy.  Neurological: He is alert and oriented to person, place, and time.  Skin: Skin is warm and dry. No rash noted. No erythema.  Psychiatric: He has a normal mood and affect. His behavior is normal.    Lab Results  Component Value Date   CD4TCELL 30 (L) 05/06/2016   Lab Results  Component Value Date   CD4TABS 640 05/06/2016   CD4TABS 930 01/03/2016   CD4TABS 380 (L) 10/19/2015   Lab Results  Component Value Date   HIV1RNAQUANT <20 05/06/2016   Lab Results  Component Value Date   HEPBSAB POS (A) 03/31/2014   Lab Results  Component Value Date   LABRPR REACTIVE (A) 05/06/2016    CBC Lab Results  Component Value Date   WBC 5.0 05/06/2016   RBC 4.77 05/06/2016   HGB 15.3 05/06/2016   HCT 44.7 05/06/2016   PLT 222 05/06/2016   MCV 93.7 05/06/2016   MCH 32.1 05/06/2016   MCHC 34.2 05/06/2016  RDW 14.1 05/06/2016   LYMPHSABS 2,050 05/06/2016   MONOABS 300 05/06/2016   EOSABS 50 05/06/2016    BMET Lab Results  Component Value Date   NA 138 05/06/2016   K 4.5 05/06/2016   CL 101 05/06/2016   CO2 27 05/06/2016   GLUCOSE 112 (H) 05/06/2016   BUN 14 05/06/2016   CREATININE  1.33 05/06/2016   CALCIUM 10.0 05/06/2016   GFRNONAA 64 05/06/2016   GFRAA 74 05/06/2016      Assessment and Plan hiv disease = will check labs, will refill meds  Long term medication monitoring = cr function is stable. No dose adjustment or regimen changes needed at this time.  Health maintenance = will give meningococcal vaccine  htn = elevated. Will watch to see if still up at next visit. Asked to do diet modification

## 2016-11-27 LAB — T-HELPER CELL (CD4) - (RCID CLINIC ONLY)
CD4 % Helper T Cell: 30 % — ABNORMAL LOW (ref 33–55)
CD4 T Cell Abs: 720 /uL (ref 400–2700)

## 2016-11-27 LAB — RPR

## 2016-11-28 LAB — HIV-1 RNA QUANT-NO REFLEX-BLD
HIV 1 RNA QUANT: NOT DETECTED {copies}/mL
HIV-1 RNA Quant, Log: <1.3 Log copies/mL

## 2017-02-13 ENCOUNTER — Ambulatory Visit: Payer: Self-pay

## 2017-02-13 ENCOUNTER — Encounter: Payer: Self-pay | Admitting: Internal Medicine

## 2017-04-05 ENCOUNTER — Other Ambulatory Visit: Payer: Self-pay | Admitting: Internal Medicine

## 2017-04-05 DIAGNOSIS — B20 Human immunodeficiency virus [HIV] disease: Secondary | ICD-10-CM

## 2017-05-29 ENCOUNTER — Ambulatory Visit (INDEPENDENT_AMBULATORY_CARE_PROVIDER_SITE_OTHER): Payer: Self-pay | Admitting: Internal Medicine

## 2017-05-29 ENCOUNTER — Encounter: Payer: Self-pay | Admitting: Internal Medicine

## 2017-05-29 VITALS — BP 143/89 | HR 69 | Temp 98.5°F | Wt 135.1 lb

## 2017-05-29 DIAGNOSIS — B20 Human immunodeficiency virus [HIV] disease: Secondary | ICD-10-CM

## 2017-05-29 DIAGNOSIS — Z23 Encounter for immunization: Secondary | ICD-10-CM

## 2017-05-29 DIAGNOSIS — R030 Elevated blood-pressure reading, without diagnosis of hypertension: Secondary | ICD-10-CM

## 2017-05-29 DIAGNOSIS — N182 Chronic kidney disease, stage 2 (mild): Secondary | ICD-10-CM

## 2017-05-29 NOTE — Patient Instructions (Signed)
Remember to do Labs 2 wks before next visit

## 2017-05-29 NOTE — Progress Notes (Signed)
Patient ID: Andrew GhaziMichael J Guzek, male   DOB: July 13, 1971, 46 y.o.   MRN: [id number]  HPI 46yo M with hiv disease, CD 4 count 720/VL<20, well controlled, currently on genvoya. He states that he is doing ok. Increasing stressor since his mother has been hospitalized for MS and uti. Outpatient Encounter Prescriptions as of 05/29/2017  Medication Sig  . elvitegravir-cobicistat-emtricitabine-tenofovir (GENVOYA) 150-150-200-10 MG TABS tablet Take 1 tablet by mouth daily with breakfast.  . GENVOYA 150-150-200-10 MG TABS tablet TAKE 1 TABLET BY MOUTH DAILY WITH BREAKFAST  . Naproxen Sodium (ALEVE PO) Take 2 tablets by mouth 2 (two) times daily as needed (pain).   No facility-administered encounter medications on file as of 05/29/2017.      Patient Active Problem List   Diagnosis Date Noted  . Asymptomatic HIV infection (HCC) 04/19/2014  . 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 TETANUS/TDAP  03/13/1990  . INFLUENZA VACCINE  02/26/2017    Social History  Substance Use Topics  . Smoking status: Never Smoker  . Smokeless tobacco: Never Used  . Alcohol use 0.0 oz/week   Review of Systems  Constitutional: Negative for fever, chills, diaphoresis, activity change, appetite change, fatigue and unexpected weight change.  HENT: Negative for congestion, sore throat, rhinorrhea, sneezing, trouble swallowing and sinus pressure.  Eyes: Negative for photophobia and visual disturbance.  Respiratory: Negative for cough, chest tightness, shortness of breath, wheezing and stridor.  Cardiovascular: Negative for chest pain, palpitations and leg swelling.  Gastrointestinal: Negative for nausea, vomiting, abdominal pain, diarrhea, constipation, blood in stool, abdominal distention and anal bleeding.  Genitourinary: Negative for dysuria, hematuria, flank pain and difficulty urinating.  Musculoskeletal: Negative for myalgias, back pain, joint swelling,  arthralgias and gait problem.  Skin: Negative for color change, pallor, rash and wound.  Neurological: Negative for dizziness, tremors, weakness and light-headedness.  Hematological: Negative for adenopathy. Does not bruise/bleed easily.  Psychiatric/Behavioral: Negative for behavioral problems, confusion, sleep disturbance, dysphoric mood, decreased concentration and agitation.    Physical Exam   BP (!) 143/89   Pulse 69   Temp 98.5 F (36.9 C) (Oral)   Wt 135 lb 1.9 oz (61.3 kg)   BMI 23.94 kg/m   Constitutional: He is oriented to person, place, and time. He appears well-developed and well-nourished. No distress.  HENT:  Mouth/Throat: Oropharynx is clear and moist. No oropharyngeal exudate.  Cardiovascular: Normal rate, regular rhythm and normal heart sounds. Exam reveals no gallop and no friction rub.  No murmur heard.  Pulmonary/Chest: Effort normal and breath sounds normal. No respiratory distress. He has no wheezes.  Abdominal: Soft. Bowel sounds are normal. He exhibits no distension. There is no tenderness.  Lymphadenopathy:  He has no cervical adenopathy.  Neurological: He is alert and oriented to person, place, and time.  Skin: Skin is warm and dry. No rash noted. No erythema.  Psychiatric: He has a normal mood and affect. His behavior is normal.     Lab Results  Component Value Date   CD4TCELL 30 (L) 11/26/2016   Lab Results  Component Value Date   CD4TABS 720 11/26/2016   CD4TABS 640 05/06/2016   CD4TABS 930 01/03/2016   Lab Results  Component Value Date   HIV1RNAQUANT <20 NOT DETECTED 11/26/2016   Lab Results  Component Value Date   HEPBSAB POS (A) 03/31/2014   Lab Results  Component Value Date   LABRPR NON REAC 11/26/2016  CBC Lab Results  Component Value Date   WBC 5.6 11/26/2016   RBC 4.84 11/26/2016   HGB 15.6 11/26/2016   HCT 45.8 11/26/2016   PLT 221 11/26/2016   MCV 94.6 11/26/2016   MCH 32.2 11/26/2016   MCHC 34.1 11/26/2016    RDW 14.2 11/26/2016   LYMPHSABS 2,128 11/26/2016   MONOABS 504 11/26/2016   EOSABS 56 11/26/2016    BMET Lab Results  Component Value Date   NA 140 11/26/2016   K 4.5 11/26/2016   CL 105 11/26/2016   CO2 25 11/26/2016   GLUCOSE 87 11/26/2016   BUN 11 11/26/2016   CREATININE 1.27 11/26/2016   CALCIUM 9.9 11/26/2016   GFRNONAA 68 11/26/2016   GFRAA 78 11/26/2016    Assessment and Plan  HIV disease = well-controlled continue with genvoya  Health maintenance = will get flu vaccine  Pre-hypertension = will continue to monitor  Ckd 2= continue to monitor/ cr 1.27

## 2017-05-30 LAB — CBC WITH DIFFERENTIAL/PLATELET
BASOS PCT: 0.2 %
Basophils Absolute: 10 cells/uL (ref 0–200)
EOS ABS: 68 {cells}/uL (ref 15–500)
Eosinophils Relative: 1.3 %
HEMATOCRIT: 44.2 % (ref 38.5–50.0)
HEMOGLOBIN: 15.4 g/dL (ref 13.2–17.1)
LYMPHS ABS: 1960 {cells}/uL (ref 850–3900)
MCH: 32.1 pg (ref 27.0–33.0)
MCHC: 34.8 g/dL (ref 32.0–36.0)
MCV: 92.1 fL (ref 80.0–100.0)
MPV: 12.2 fL (ref 7.5–12.5)
Monocytes Relative: 8.7 %
Neutro Abs: 2709 cells/uL (ref 1500–7800)
Neutrophils Relative %: 52.1 %
PLATELETS: 213 10*3/uL (ref 140–400)
RBC: 4.8 10*6/uL (ref 4.20–5.80)
RDW: 13.3 % (ref 11.0–15.0)
Total Lymphocyte: 37.7 %
WBC: 5.2 10*3/uL (ref 3.8–10.8)
WBCMIX: 452 {cells}/uL (ref 200–950)

## 2017-05-30 LAB — COMPLETE METABOLIC PANEL WITH GFR
AG Ratio: 1.5 (calc) (ref 1.0–2.5)
ALBUMIN MSPROF: 4.7 g/dL (ref 3.6–5.1)
ALKALINE PHOSPHATASE (APISO): 62 U/L (ref 40–115)
ALT: 78 U/L — ABNORMAL HIGH (ref 9–46)
AST: 57 U/L — ABNORMAL HIGH (ref 10–40)
BUN: 13 mg/dL (ref 7–25)
CALCIUM: 10 mg/dL (ref 8.6–10.3)
CO2: 27 mmol/L (ref 20–32)
CREATININE: 1.16 mg/dL (ref 0.60–1.35)
Chloride: 105 mmol/L (ref 98–110)
GFR, EST NON AFRICAN AMERICAN: 75 mL/min/{1.73_m2} (ref >=60)
GFR, Est African American: 87 mL/min/{1.73_m2} (ref >=60)
GLOBULIN: 3.1 g/dL (ref 1.9–3.7)
Glucose, Bld: 88 mg/dL (ref 65–99)
Potassium: 4.4 mmol/L (ref 3.5–5.3)
Sodium: 139 mmol/L (ref 135–146)
Total Bilirubin: 0.5 mg/dL (ref 0.2–1.2)
Total Protein: 7.8 g/dL (ref 6.1–8.1)

## 2017-05-30 LAB — T-HELPER CELL (CD4) - (RCID CLINIC ONLY)
CD4 T CELL ABS: 510 /uL (ref 400–2700)
CD4 T CELL HELPER: 24 % — AB (ref 33–55)

## 2017-06-02 LAB — HIV-1 RNA QUANT-NO REFLEX-BLD
HIV 1 RNA Quant: <20 copies/mL — AB
HIV-1 RNA QUANT, LOG: DETECTED {Log_copies}/mL — AB

## 2017-09-13 ENCOUNTER — Other Ambulatory Visit: Payer: Self-pay | Admitting: Internal Medicine

## 2017-09-13 DIAGNOSIS — B20 Human immunodeficiency virus [HIV] disease: Secondary | ICD-10-CM

## 2017-09-26 ENCOUNTER — Encounter: Payer: Self-pay | Admitting: Internal Medicine

## 2017-12-02 ENCOUNTER — Encounter: Payer: Self-pay | Admitting: Internal Medicine

## 2017-12-02 ENCOUNTER — Ambulatory Visit (INDEPENDENT_AMBULATORY_CARE_PROVIDER_SITE_OTHER): Payer: Self-pay | Admitting: Internal Medicine

## 2017-12-02 VITALS — Ht 63.0 in | Wt 134.0 lb

## 2017-12-02 DIAGNOSIS — B20 Human immunodeficiency virus [HIV] disease: Secondary | ICD-10-CM

## 2017-12-02 DIAGNOSIS — R21 Rash and other nonspecific skin eruption: Secondary | ICD-10-CM

## 2017-12-02 DIAGNOSIS — Z0000 Encounter for general adult medical examination without abnormal findings: Secondary | ICD-10-CM

## 2017-12-02 DIAGNOSIS — Z23 Encounter for immunization: Secondary | ICD-10-CM

## 2017-12-02 DIAGNOSIS — Z79899 Other long term (current) drug therapy: Secondary | ICD-10-CM

## 2017-12-02 MED ORDER — MOMETASONE FUROATE 0.1 % EX CREA: 1.0000 "application " | TOPICAL_CREAM | Freq: Every day | CUTANEOUS | 0 refills | Status: AC

## 2017-12-02 MED ORDER — MOMETASONE FUROATE 0.1 % EX SOLN: Freq: Every day | CUTANEOUS | 0 refills | Status: DC

## 2017-12-02 NOTE — Patient Instructions (Signed)
For the rash on your neck, Pick up Lotrimin - over the counter cream to apply daily x 7-10 days to see if that resolves the rash   If rash is still present, Can fill prescription for [facility]

## 2017-12-02 NOTE — Progress Notes (Signed)
RFV: follow up for hiv disease  Patient ID: Andrew Potter, male   DOB: 01-04-1971, 47 y.o.   MRN: [id number]  HPI Andrew Potter is a 47yo M with hiv disease, CD 4 count of 510/VL<20 in Nov 2018. He is on genvoya doing well with medication. No recent illnesses since we last saw him in clinic  ROS: He reports having occasional Pruritic rash to base of neck, 12 point ros is otherwise negative.  Sochx: no sexual relations since last appt.  Outpatient Encounter Medications as of 47/01/2018  Medication Sig  . GENVOYA 150-150-200-10 MG TABS tablet TAKE 1 TABLET BY MOUTH DAILY WITH BREAKFAST  . Naproxen Sodium (ALEVE PO) Take 2 tablets by mouth 2 (two) times daily as needed (pain).  . [DISCONTINUED] elvitegravir-cobicistat-emtricitabine-tenofovir (GENVOYA) 150-150-200-10 MG TABS tablet Take 1 tablet by mouth daily with breakfast.   No facility-administered encounter medications on file as of 12/02/2017.      Patient Active Problem List   Diagnosis Date Noted  . Asymptomatic HIV infection (HCC) 04/19/2014  . 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 Janet Berlin  03/13/1990      Physical Exam   Ht  (1.6 m)   Wt 134 lb (60.8 kg)   BMI 23.74 kg/m   Physical Exam  Constitutional: He is oriented to person, place, and time. He appears well-developed and well-nourished. No distress.  HENT:  Mouth/Throat: Oropharynx is clear and moist. No oropharyngeal exudate.  Cardiovascular: Normal rate, regular rhythm and normal heart sounds. Exam reveals no gallop and no friction rub.  No murmur heard.  Pulmonary/Chest: Effort normal and breath sounds normal. No respiratory distress. He has no wheezes.   Lymphadenopathy:  He has no cervical adenopathy.  Neurological: He is alert and oriented to person, place, and time.  Skin: Skin is warm and dry. No rash noted. No erythema. Faint small raised papules at based o neck, scattered Psychiatric: He  has a normal mood and affect. His behavior is normal.    Lab Results  Component Value Date   CD4TCELL 24 (L) 05/29/2017   Lab Results  Component Value Date   CD4TABS 510 05/29/2017   CD4TABS 720 11/26/2016   CD4TABS 640 05/06/2016   Lab Results  Component Value Date   HIV1RNAQUANT <20 DETECTED (A) 05/29/2017   Lab Results  Component Value Date   HEPBSAB POS (A) 03/31/2014   Lab Results  Component Value Date   LABRPR NON REAC 11/26/2016    CBC Lab Results  Component Value Date   WBC 5.2 05/29/2017   RBC 4.80 05/29/2017   HGB 15.4 05/29/2017   HCT 44.2 05/29/2017   PLT 213 05/29/2017   MCV 92.1 05/29/2017   MCH 32.1 05/29/2017   MCHC 34.8 05/29/2017   RDW 13.3 05/29/2017   LYMPHSABS 1,960 05/29/2017   MONOABS 504 11/26/2016   EOSABS 68 05/29/2017    BMET Lab Results  Component Value Date   NA 139 05/29/2017   K 4.4 05/29/2017   CL 105 05/29/2017   CO2 27 05/29/2017   GLUCOSE 88 05/29/2017   BUN 13 05/29/2017   CREATININE 1.16 05/29/2017   CALCIUM 10.0 05/29/2017   GFRNONAA 75 05/29/2017   GFRAA 87 05/29/2017      Assessment and Plan  hiv disease = will get labs today. Has been previously well controlled. Plan on continue on current regimen. Consider switch to biktarvy at next visit  Long term  medication management = creatinine is stable. No need to change regimen at this time  Health maintenance = will give prevnar 13 today  Rash = will do empiric trial of antifungal if not improved. Will try steroid cream

## 2017-12-03 LAB — CBC WITH DIFFERENTIAL/PLATELET
BASOS ABS: 28 {cells}/uL (ref 0–200)
Basophils Relative: 0.5 %
EOS PCT: 2.5 %
Eosinophils Absolute: 140 cells/uL (ref 15–500)
HEMATOCRIT: 44.4 % (ref 38.5–50.0)
Hemoglobin: 15.3 g/dL (ref 13.2–17.1)
LYMPHS ABS: 2218 {cells}/uL (ref 850–3900)
MCH: 32.1 pg (ref 27.0–33.0)
MCHC: 34.5 g/dL (ref 32.0–36.0)
MCV: 93.1 fL (ref 80.0–100.0)
MPV: 12.4 fL (ref 7.5–12.5)
Monocytes Relative: 11 %
NEUTROS PCT: 46.4 %
Neutro Abs: 2598 cells/uL (ref 1500–7800)
PLATELETS: 206 10*3/uL (ref 140–400)
RBC: 4.77 10*6/uL (ref 4.20–5.80)
RDW: 13.4 % (ref 11.0–15.0)
Total Lymphocyte: 39.6 %
WBC: 5.6 10*3/uL (ref 3.8–10.8)
WBCMIX: 616 {cells}/uL (ref 200–950)

## 2017-12-03 LAB — COMPLETE METABOLIC PANEL WITH GFR
AG Ratio: 1.4 (calc) (ref 1.0–2.5)
ALKALINE PHOSPHATASE (APISO): 57 U/L (ref 40–115)
ALT: 41 U/L (ref 9–46)
AST: 31 U/L (ref 10–40)
Albumin: 4.5 g/dL (ref 3.6–5.1)
BUN: 11 mg/dL (ref 7–25)
CHLORIDE: 107 mmol/L (ref 98–110)
CO2: 29 mmol/L (ref 20–32)
Calcium: 9.8 mg/dL (ref 8.6–10.3)
Creat: 1.13 mg/dL (ref 0.60–1.35)
GFR, Est African American: 90 mL/min/{1.73_m2} (ref >=60)
GFR, Est Non African American: 78 mL/min/{1.73_m2} (ref >=60)
GLOBULIN: 3.3 g/dL (ref 1.9–3.7)
Glucose, Bld: 84 mg/dL (ref 65–99)
Potassium: 4.2 mmol/L (ref 3.5–5.3)
SODIUM: 142 mmol/L (ref 135–146)
Total Bilirubin: 0.4 mg/dL (ref 0.2–1.2)
Total Protein: 7.8 g/dL (ref 6.1–8.1)

## 2017-12-03 LAB — RPR: RPR: NONREACTIVE

## 2017-12-03 LAB — T-HELPER CELL (CD4) - (RCID CLINIC ONLY)
CD4 % Helper T Cell: 28 % — ABNORMAL LOW (ref 33–55)
CD4 T Cell Abs: 600 /uL (ref 400–2700)

## 2017-12-03 LAB — URINE CYTOLOGY ANCILLARY ONLY
Chlamydia: NEGATIVE
Neisseria Gonorrhea: NEGATIVE

## 2017-12-04 LAB — HIV-1 RNA QUANT-NO REFLEX-BLD
HIV 1 RNA Quant: <20 copies/mL
HIV-1 RNA QUANT, LOG: NOT DETECTED {Log_copies}/mL

## 2017-12-25 ENCOUNTER — Telehealth: Payer: Self-pay | Admitting: *Deleted

## 2017-12-25 NOTE — Telephone Encounter (Signed)
Patient reports cold symptoms (sinus pressure, stuffy nose, cough; no fever) x 1 day, wants to know if over the counter cold medication is appropriate (contains acetaminophen, guaifenesin, phenylephrine) and if it will help with all of his symptoms. RN encouraged patient to take that as directed, that he should stay well-hydrated, add humidification (steamy shower/humidifyier at night, add 1 pillow under his head to help with drainage). He may also alternate with ibuprofen/motrin/advil over the counter as directed on the bottle to help with inflammation/pressure. Andree Coss, RN

## 2018-01-08 ENCOUNTER — Other Ambulatory Visit: Payer: Self-pay | Admitting: Internal Medicine

## 2018-01-08 DIAGNOSIS — B20 Human immunodeficiency virus [HIV] disease: Secondary | ICD-10-CM

## 2018-02-10 ENCOUNTER — Other Ambulatory Visit: Payer: Self-pay | Admitting: Internal Medicine

## 2018-02-10 DIAGNOSIS — B20 Human immunodeficiency virus [HIV] disease: Secondary | ICD-10-CM

## 2018-02-17 ENCOUNTER — Encounter: Payer: Self-pay | Admitting: Internal Medicine

## 2018-03-12 ENCOUNTER — Other Ambulatory Visit: Payer: Self-pay | Admitting: Internal Medicine

## 2018-03-12 DIAGNOSIS — B20 Human immunodeficiency virus [HIV] disease: Secondary | ICD-10-CM

## 2018-06-03 ENCOUNTER — Ambulatory Visit: Payer: Self-pay | Admitting: Internal Medicine

## 2018-06-09 ENCOUNTER — Ambulatory Visit (INDEPENDENT_AMBULATORY_CARE_PROVIDER_SITE_OTHER): Payer: Self-pay | Admitting: Internal Medicine

## 2018-06-09 ENCOUNTER — Encounter: Payer: Self-pay | Admitting: Internal Medicine

## 2018-06-09 ENCOUNTER — Ambulatory Visit (INDEPENDENT_AMBULATORY_CARE_PROVIDER_SITE_OTHER): Payer: Self-pay | Admitting: Licensed Clinical Social Worker

## 2018-06-09 VITALS — BP 114/74 | HR 63 | Temp 98.5°F | Ht 63.0 in | Wt 129.0 lb

## 2018-06-09 DIAGNOSIS — F4320 Adjustment disorder, unspecified: Secondary | ICD-10-CM

## 2018-06-09 DIAGNOSIS — Z79899 Other long term (current) drug therapy: Secondary | ICD-10-CM

## 2018-06-09 DIAGNOSIS — B20 Human immunodeficiency virus [HIV] disease: Secondary | ICD-10-CM

## 2018-06-09 DIAGNOSIS — Z23 Encounter for immunization: Secondary | ICD-10-CM

## 2018-06-09 MED ORDER — ELVITEG-COBIC-EMTRICIT-TENOFAF 150-150-200-10 MG PO TABS: 1.0000 | ORAL_TABLET | Freq: Every day | ORAL | 6 refills | Status: DC

## 2018-06-09 NOTE — Progress Notes (Signed)
Flu vaccine administered, patient tolerated well.

## 2018-06-09 NOTE — Progress Notes (Deleted)
Integrated Behavioral Health Initial Visit  MRN: [id number] Name: Andrew Potter  Number of Integrated Behavioral Health Clinician visits:: 1/6 Session Start time: 9:32am Session End time: 9:48am Total time: 15 minutes  Type of Service: Integrated Behavioral Health- Individual/Family Interpretor:No. Interpretor Name and Language: n/a   Warm Hand Off Completed.       SUBJECTIVE: Andrew Potter is a 47 y.o. male accompanied by self Patient was referred by Dr Snider for adjustment concerns.  OBJECTIVE: Mood: Euthymic and Affect: Constricted Risk of harm to self or others: No plan to harm self or others  LIFE CONTEXT: Patient reports his life is pretty stable at this point, he is a long term patient at [facility] and has no concerns about housing or various logistical dimensions.   GOALS ADDRESSED: Patient will: 1. Demonstrate ability to: Increase healthy adjustment to current life circumstances  INTERVENTIONS: Interventions utilized: Supportive Counseling and Psychoeducation and/or Health Education    ASSESSMENT: Patient currently experiencing few disturbances of mood or behaviors. In the past he has had some stressors and anxiety, but currently does not report these. He has had some stressors and sometimes struggles with them. Most consistent diagnosis at this point is Adjustment Disorder.  He reports life being pretty stable, but having ups and downs in adjusting to life situations sometimes. Counselor provided patient with education on the counseling services offered at [facility], including availability, scope of practice, and how to schedule. Patient and counselor discussed any concerns patient may have at this time, and he stats he does not see the need for counseling right now, but has in the past and would like to be able to contact couselor if there is need in the future (counselor agreed).Counselor and patient discussed ways that counseling may be beneficial to patient over the  long-term.   Patient may benefit from as needed appointments with counselor, based on stressors and adjustment.  PLAN: 1. Patient will call to schedule appointments if/as needed.  Daniell Mancinas, LCSW        

## 2018-06-09 NOTE — Progress Notes (Signed)
RFV: follow up for 6 month visit  Patient ID: Andrew Potter, male   DOB: 01-30-1971, 47 y.o.   MRN: [id number]  HPI 47yo M with hiv disease, CD 4 count of 600/VL<20 on genvoya. Who reports doing well with adherence. No recent illnesses. Not in any new relationships. Decreased mood of late, interested in meeting clinic counselor.  Outpatient Encounter Medications as of 06/09/2018  Medication Sig  . GENVOYA 150-150-200-10 MG TABS tablet TAKE 1 TABLET BY MOUTH DAILY WITH BREAKFAST  . Naproxen Sodium (ALEVE PO) Take 2 tablets by mouth 2 (two) times daily as needed (pain).  . mometasone (ELOCON) 0.1 % cream Apply 1 application topically daily. (Patient not taking: Reported on 06/09/2018)   No facility-administered encounter medications on file as of 06/09/2018.      Patient Active Problem List   Diagnosis Date Noted  . Asymptomatic HIV infection (HCC) 04/19/2014  . 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 TETANUS/TDAP  03/13/1990  . INFLUENZA VACCINE  02/26/2018     Review of Systems Review of Systems  Constitutional: Negative for fever, chills, diaphoresis, activity change, appetite change, fatigue and unexpected weight change.  HENT: Negative for congestion, sore throat, rhinorrhea, sneezing, trouble swallowing and sinus pressure.  Eyes: Negative for photophobia and visual disturbance.  Respiratory: Negative for cough, chest tightness, shortness of breath, wheezing and stridor.  Cardiovascular: Negative for chest pain, palpitations and leg swelling.  Gastrointestinal: Negative for nausea, vomiting, abdominal pain, diarrhea, constipation, blood in stool, abdominal distention and anal bleeding.  Genitourinary: Negative for dysuria, hematuria, flank pain and difficulty urinating.  Musculoskeletal: Negative for myalgias, back pain, joint swelling, arthralgias and gait problem.  Skin: Negative for color change, pallor, rash and  wound.  Neurological: Negative for dizziness, tremors, weakness and light-headedness.  Hematological: Negative for adenopathy. Does not bruise/bleed easily.  Psychiatric/Behavioral: Negative for behavioral problems, confusion, sleep disturbance, dysphoric mood, decreased concentration and agitation.    Physical Exam   BP 114/74   Pulse 63   Temp 98.5 F (36.9 C)   Ht 5\' 3"  (1.6 m)   Wt 129 lb (58.5 kg)   BMI 22.85 kg/m   Physical Exam  Constitutional: He is oriented to person, place, and time. He appears well-developed and well-nourished. No distress.  HENT:  Mouth/Throat: Oropharynx is clear and moist. No oropharyngeal exudate.  Cardiovascular: Normal rate, regular rhythm and normal heart sounds. Exam reveals no gallop and no friction rub.  No murmur heard.  Pulmonary/Chest: Effort normal and breath sounds normal. No respiratory distress. He has no wheezes.  Abdominal: Soft. Bowel sounds are normal. He exhibits no distension. There is no tenderness.  Lymphadenopathy:  He has no cervical adenopathy.  Neurological: He is alert and oriented to person, place, and time.  Skin: Skin is warm and dry. No rash noted. No erythema.  Psychiatric: He has a normal mood and affect. His behavior is normal.    Lab Results  Component Value Date   CD4TCELL 28 (L) 12/02/2017   Lab Results  Component Value Date   CD4TABS 600 12/02/2017   CD4TABS 510 05/29/2017   CD4TABS 720 11/26/2016   Lab Results  Component Value Date   HIV1RNAQUANT <20 NOT DETECTED 12/02/2017   Lab Results  Component Value Date   HEPBSAB POS (A) 03/31/2014   Lab Results  Component Value Date   LABRPR NON-REACTIVE 12/02/2017    CBC Lab Results  Component Value Date  WBC 5.6 12/02/2017   RBC 4.77 12/02/2017   HGB 15.3 12/02/2017   HCT 44.4 12/02/2017   PLT 206 12/02/2017   MCV 93.1 12/02/2017   MCH 32.1 12/02/2017   MCHC 34.5 12/02/2017   RDW 13.4 12/02/2017   LYMPHSABS 2,218 12/02/2017   MONOABS 504  11/26/2016   EOSABS 140 12/02/2017    BMET Lab Results  Component Value Date   NA 142 12/02/2017   K 4.2 12/02/2017   CL 107 12/02/2017   CO2 29 12/02/2017   GLUCOSE 84 12/02/2017   BUN 11 12/02/2017   CREATININE 1.13 12/02/2017   CALCIUM 9.8 12/02/2017   GFRNONAA 78 12/02/2017   GFRAA 90 12/02/2017      Assessment and Plan hiv = will get labs, refills. Discussed changing to biktarvy - he will review information to decide if he wants to change regimen  Long term medication management =will check cr to see that it is stable.  Decreased mood/dysthemia = will meed with regina, counselor  Health maintenance = gave flu shot

## 2018-06-09 NOTE — BH Specialist Note (Signed)
Integrated Behavioral Health Initial Visit  MRN: [id number] Name: Manuella GhaziMichael J Lavalle  Number of Integrated Behavioral Health Clinician visits:: 1/6 Session Start time: 9:32am Session End time: 9:48am Total time: 15 minutes  Type of Service: Integrated Behavioral Health- Individual/Family Interpretor:No. Interpretor Name and Language: n/a   Warm Hand Off Completed.       SUBJECTIVE: Manuella GhaziMichael J Cloyd is a 47 y.o. male accompanied by self Patient was referred by Dr Drue SecondSnider for adjustment concerns.  OBJECTIVE: Mood: Euthymic and Affect: Constricted Risk of harm to self or others: No plan to harm self or others  LIFE CONTEXT: Patient reports his life is pretty stable at this point, he is a [facility] patient [facility] and has no concerns about housing or various logistical dimensions.   GOALS ADDRESSED: Patient will: 1. Demonstrate ability to: Increase healthy adjustment to current life circumstances  INTERVENTIONS: Interventions utilized: Supportive Counseling and Psychoeducation and/or Health Education    ASSESSMENT: Patient currently experiencing few disturbances of mood or behaviors. In the past he has had some stressors and anxiety, but currently does not report these. He has had some stressors and sometimes struggles with them. Most consistent diagnosis at this point is Adjustment Disorder.  He reports life being pretty stable, but having ups and downs in adjusting to life situations sometimes. Counselor provided patient with education on the counseling services offered at [facility], including availability, scope of practice, and how to schedule. Patient and counselor discussed any concerns patient may have at this time, and he stats he does not see the need for counseling right now, but has in the past and would like to be able to contact couselor if there is need in the future (counselor agreed).Counselor and patient discussed ways that counseling may be beneficial to patient over the  long-term.   Patient may benefit from as needed appointments with counselor, based on stressors and adjustment.  PLAN: 1. Patient will call to schedule appointments if/as needed.  Angus Palmsegina Maheen Cwikla, LCSW

## 2018-06-10 LAB — T-HELPER CELL (CD4) - (RCID CLINIC ONLY)
CD4 % Helper T Cell: 23 % — ABNORMAL LOW (ref 33–55)
CD4 T Cell Abs: 410 /uL (ref 400–2700)

## 2018-06-10 LAB — URINE CYTOLOGY ANCILLARY ONLY
Chlamydia: NEGATIVE
Neisseria Gonorrhea: NEGATIVE

## 2018-06-11 LAB — LIPID PANEL
Cholesterol: 120 mg/dL (ref <200)
HDL: 52 mg/dL (ref >40)
LDL Cholesterol (Calc): 54 mg/dL (calc)
Non-HDL Cholesterol (Calc): 68 mg/dL (calc) (ref <130)
Total CHOL/HDL Ratio: 2.3 (calc) (ref <5.0)
Triglycerides: 59 mg/dL (ref <150)

## 2018-06-11 LAB — CBC WITH DIFFERENTIAL/PLATELET
Basophils Absolute: 21 cells/uL (ref 0–200)
Basophils Relative: 0.4 %
Eosinophils Absolute: 120 cells/uL (ref 15–500)
Eosinophils Relative: 2.3 %
HCT: 43.3 % (ref 38.5–50.0)
Hemoglobin: 15 g/dL (ref 13.2–17.1)
Lymphs Abs: 1685 cells/uL (ref 850–3900)
MCH: 32.2 pg (ref 27.0–33.0)
MCHC: 34.6 g/dL (ref 32.0–36.0)
MCV: 92.9 fL (ref 80.0–100.0)
MPV: 11.8 fL (ref 7.5–12.5)
Monocytes Relative: 9.7 %
Neutro Abs: 2870 cells/uL (ref 1500–7800)
Neutrophils Relative %: 55.2 %
Platelets: 201 10*3/uL (ref 140–400)
RBC: 4.66 10*6/uL (ref 4.20–5.80)
RDW: 13.3 % (ref 11.0–15.0)
Total Lymphocyte: 32.4 %
WBC mixed population: 504 cells/uL (ref 200–950)
WBC: 5.2 10*3/uL (ref 3.8–10.8)

## 2018-06-11 LAB — COMPLETE METABOLIC PANEL WITH GFR
AG Ratio: 1.5 (calc) (ref 1.0–2.5)
ALT: 36 U/L (ref 9–46)
AST: 28 U/L (ref 10–40)
Albumin: 4.5 g/dL (ref 3.6–5.1)
Alkaline phosphatase (APISO): 56 U/L (ref 40–115)
BUN: 12 mg/dL (ref 7–25)
CO2: 30 mmol/L (ref 20–32)
Calcium: 10 mg/dL (ref 8.6–10.3)
Chloride: 104 mmol/L (ref 98–110)
Creat: 1.07 mg/dL (ref 0.60–1.35)
GFR, Est African American: 95 mL/min/{1.73_m2} (ref >=60)
GFR, Est Non African American: 82 mL/min/{1.73_m2} (ref >=60)
Globulin: 3 g/dL (calc) (ref 1.9–3.7)
Glucose, Bld: 127 mg/dL — ABNORMAL HIGH (ref 65–99)
Potassium: 4.8 mmol/L (ref 3.5–5.3)
Sodium: 140 mmol/L (ref 135–146)
Total Bilirubin: 0.7 mg/dL (ref 0.2–1.2)
Total Protein: 7.5 g/dL (ref 6.1–8.1)

## 2018-06-11 LAB — HIV-1 RNA QUANT-NO REFLEX-BLD
HIV 1 RNA Quant: <20 copies/mL
HIV-1 RNA Quant, Log: <1.3 Log copies/mL

## 2018-06-11 LAB — RPR: RPR Ser Ql: REACTIVE — AB

## 2018-06-11 LAB — RPR TITER

## 2018-06-11 LAB — FLUORESCENT TREPONEMAL AB(FTA)-IGG-BLD: Fluorescent Treponemal ABS: REACTIVE — AB

## 2018-09-23 ENCOUNTER — Encounter: Payer: Self-pay | Admitting: Internal Medicine

## 2018-11-19 ENCOUNTER — Other Ambulatory Visit: Payer: Self-pay | Admitting: Internal Medicine

## 2018-11-19 DIAGNOSIS — B20 Human immunodeficiency virus [HIV] disease: Secondary | ICD-10-CM

## 2018-12-03 ENCOUNTER — Encounter: Payer: Self-pay | Admitting: Internal Medicine

## 2018-12-03 ENCOUNTER — Ambulatory Visit (INDEPENDENT_AMBULATORY_CARE_PROVIDER_SITE_OTHER): Payer: Self-pay | Admitting: Internal Medicine

## 2018-12-03 ENCOUNTER — Other Ambulatory Visit: Payer: Self-pay

## 2018-12-03 VITALS — BP 132/80 | HR 61 | Temp 98.0°F | Ht 63.0 in | Wt 136.0 lb

## 2018-12-03 DIAGNOSIS — K029 Dental caries, unspecified: Secondary | ICD-10-CM

## 2018-12-03 DIAGNOSIS — B20 Human immunodeficiency virus [HIV] disease: Secondary | ICD-10-CM

## 2018-12-03 DIAGNOSIS — R030 Elevated blood-pressure reading, without diagnosis of hypertension: Secondary | ICD-10-CM

## 2018-12-03 NOTE — Progress Notes (Signed)
RFV: follow up for hiv disease  Patient ID: Andrew Potter, male   DOB: 04/16/1971, 48 y.o.   MRN: [id number]  HPI Andrew Potter is 48yo M with hiv disease, CD 4 count of 410/VL<20 in nov doing well with genvoya. No recent health problems or illnesses requiring visits. He has been sheltering in place.no covid sick contacts Outpatient Encounter Medications as of 12/03/2018  Medication Sig  . GENVOYA 150-150-200-10 MG TABS tablet TAKE 1 TABLET BY MOUTH DAILY WITH BREAKFAST  . mometasone (ELOCON) 0.1 % cream Apply 1 application topically daily. (Patient not taking: Reported on 12/03/2018)  . Naproxen Sodium (ALEVE PO) Take 2 tablets by mouth 2 (two) times daily as needed (pain).   No facility-administered encounter medications on file as of 12/03/2018.      Patient Active Problem List   Diagnosis Date Noted  . Asymptomatic HIV infection (HCC) 04/19/2014  . 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 TETANUS/TDAP  03/13/1990     Review of Systems Review of Systems  Constitutional: Negative for fever, chills, diaphoresis, activity change, appetite change, fatigue and unexpected weight change.  HENT: Negative for congestion, sore throat, rhinorrhea, sneezing, trouble swallowing and sinus pressure.  Eyes: Negative for photophobia and visual disturbance.  Respiratory: Negative for cough, chest tightness, shortness of breath, wheezing and stridor.  Cardiovascular: Negative for chest pain, palpitations and leg swelling.  Gastrointestinal: Negative for nausea, vomiting, abdominal pain, diarrhea, constipation, blood in stool, abdominal distention and anal bleeding.  Genitourinary: Negative for dysuria, hematuria, flank pain and difficulty urinating.  Musculoskeletal: Negative for myalgias, back pain, joint swelling, arthralgias and gait problem.  Skin: Negative for color change, pallor, rash and wound.  Neurological: Negative for dizziness,  tremors, weakness and light-headedness.  Hematological: Negative for adenopathy. Does not bruise/bleed easily.  Psychiatric/Behavioral: Negative for behavioral problems, confusion, sleep disturbance, dysphoric mood, decreased concentration and agitation.    Sochx: no drinking or smoking Physical Exam   BP 132/80   Pulse 61   Temp 98 F (36.7 C)   Ht 5\' 3"  (1.6 m)   Wt 136 lb (61.7 kg)   BMI 24.09 kg/m   Physical Exam  Constitutional: He is oriented to person, place, and time. He appears well-developed and well-nourished. No distress.  HENT:  Mouth/Throat: Oropharynx is clear and moist. No oropharyngeal exudate.  Cardiovascular: Normal rate, regular rhythm and normal heart sounds. Exam reveals no gallop and no friction rub.  No murmur heard.  Pulmonary/Chest: Effort normal and breath sounds normal. No respiratory distress. He has no wheezes.  Abdominal: Soft. Bowel sounds are normal. He exhibits no distension. There is no tenderness.  Lymphadenopathy:  He has no cervical adenopathy.  Neurological: He is alert and oriented to person, place, and time.  Skin: Skin is warm and dry. No rash noted. No erythema.  Psychiatric: He has a normal mood and affect. His behavior is normal.    Lab Results  Component Value Date   CD4TCELL 23 (L) 06/09/2018   Lab Results  Component Value Date   CD4TABS 410 06/09/2018   CD4TABS 600 12/02/2017   CD4TABS 510 05/29/2017   Lab Results  Component Value Date   HIV1RNAQUANT <20 NOT DETECTED 06/09/2018   Lab Results  Component Value Date   HEPBSAB POS (A) 03/31/2014   Lab Results  Component Value Date   LABRPR REACTIVE (A) 06/09/2018    CBC Lab Results  Component Value Date  WBC 5.2 06/09/2018   RBC 4.66 06/09/2018   HGB 15.0 06/09/2018   HCT 43.3 06/09/2018   PLT 201 06/09/2018   MCV 92.9 06/09/2018   MCH 32.2 06/09/2018   MCHC 34.6 06/09/2018   RDW 13.3 06/09/2018   LYMPHSABS 1,685 06/09/2018   MONOABS 504 11/26/2016    EOSABS 120 06/09/2018    BMET Lab Results  Component Value Date   NA 140 06/09/2018   K 4.8 06/09/2018   CL 104 06/09/2018   CO2 30 06/09/2018   GLUCOSE 127 (H) 06/09/2018   BUN 12 06/09/2018   CREATININE 1.07 06/09/2018   CALCIUM 10.0 06/09/2018   GFRNONAA 82 06/09/2018   GFRAA 95 06/09/2018      Assessment and Plan Dental caries = - interested in dental services  hiv disease - hiv well controlled. Continue with current regimen. Will do lab work today  Pre-Hypertension =bp undergood control, no need for any medication  rtc in 4 mo

## 2018-12-04 LAB — T-HELPER CELL (CD4) - (RCID CLINIC ONLY)
CD4 % Helper T Cell: 33 % (ref 33–65)
CD4 T Cell Abs: 580 /uL (ref 400–1790)

## 2018-12-07 ENCOUNTER — Ambulatory Visit: Payer: Self-pay | Admitting: Internal Medicine

## 2018-12-14 LAB — CBC WITH DIFFERENTIAL/PLATELET
Absolute Monocytes: 502 cells/uL (ref 200–950)
Basophils Absolute: 22 cells/uL (ref 0–200)
Basophils Relative: 0.4 %
Eosinophils Absolute: 70 cells/uL (ref 15–500)
Eosinophils Relative: 1.3 %
HCT: 45.5 % (ref 38.5–50.0)
Hemoglobin: 15.5 g/dL (ref 13.2–17.1)
Lymphs Abs: 1890 cells/uL (ref 850–3900)
MCH: 31.6 pg (ref 27.0–33.0)
MCHC: 34.1 g/dL (ref 32.0–36.0)
MCV: 92.9 fL (ref 80.0–100.0)
MPV: 12.2 fL (ref 7.5–12.5)
Monocytes Relative: 9.3 %
Neutro Abs: 2916 cells/uL (ref 1500–7800)
Neutrophils Relative %: 54 %
Platelets: 229 10*3/uL (ref 140–400)
RBC: 4.9 10*6/uL (ref 4.20–5.80)
RDW: 13.8 % (ref 11.0–15.0)
Total Lymphocyte: 35 %
WBC: 5.4 10*3/uL (ref 3.8–10.8)

## 2018-12-14 LAB — COMPLETE METABOLIC PANEL WITH GFR
AG Ratio: 1.6 (calc) (ref 1.0–2.5)
ALT: 65 U/L — ABNORMAL HIGH (ref 9–46)
AST: 53 U/L — ABNORMAL HIGH (ref 10–40)
Albumin: 4.7 g/dL (ref 3.6–5.1)
Alkaline phosphatase (APISO): 61 U/L (ref 36–130)
BUN: 11 mg/dL (ref 7–25)
CO2: 27 mmol/L (ref 20–32)
Calcium: 10.2 mg/dL (ref 8.6–10.3)
Chloride: 103 mmol/L (ref 98–110)
Creat: 1.18 mg/dL (ref 0.60–1.35)
GFR, Est African American: 85 mL/min/{1.73_m2} (ref >=60)
GFR, Est Non African American: 73 mL/min/{1.73_m2} (ref >=60)
Globulin: 3 g/dL (calc) (ref 1.9–3.7)
Glucose, Bld: 91 mg/dL (ref 65–99)
Potassium: 4.8 mmol/L (ref 3.5–5.3)
Sodium: 138 mmol/L (ref 135–146)
Total Bilirubin: 0.4 mg/dL (ref 0.2–1.2)
Total Protein: 7.7 g/dL (ref 6.1–8.1)

## 2018-12-14 LAB — HIV-1 RNA QUANT-NO REFLEX-BLD
HIV 1 RNA Quant: <20 copies/mL
HIV-1 RNA Quant, Log: <1.3 Log copies/mL

## 2018-12-14 LAB — RPR TITER: RPR Titer: 1:1 {titer} — ABNORMAL HIGH

## 2018-12-14 LAB — FLUORESCENT TREPONEMAL AB(FTA)-IGG-BLD: Fluorescent Treponemal ABS: REACTIVE — AB

## 2018-12-14 LAB — RPR: RPR Ser Ql: REACTIVE — AB

## 2019-02-17 ENCOUNTER — Other Ambulatory Visit: Payer: Self-pay | Admitting: Internal Medicine

## 2019-02-17 DIAGNOSIS — B20 Human immunodeficiency virus [HIV] disease: Secondary | ICD-10-CM

## 2019-02-23 ENCOUNTER — Encounter: Payer: Self-pay | Admitting: Internal Medicine

## 2019-03-19 ENCOUNTER — Other Ambulatory Visit: Payer: Self-pay | Admitting: Internal Medicine

## 2019-03-19 DIAGNOSIS — B20 Human immunodeficiency virus [HIV] disease: Secondary | ICD-10-CM

## 2019-04-06 ENCOUNTER — Encounter: Payer: Self-pay | Admitting: Internal Medicine

## 2019-04-06 ENCOUNTER — Other Ambulatory Visit: Payer: Self-pay

## 2019-04-06 ENCOUNTER — Ambulatory Visit (INDEPENDENT_AMBULATORY_CARE_PROVIDER_SITE_OTHER): Payer: Self-pay | Admitting: Internal Medicine

## 2019-04-06 ENCOUNTER — Other Ambulatory Visit: Payer: Self-pay | Admitting: *Deleted

## 2019-04-06 VITALS — BP 135/88 | HR 71 | Temp 98.6°F

## 2019-04-06 DIAGNOSIS — B20 Human immunodeficiency virus [HIV] disease: Secondary | ICD-10-CM

## 2019-04-06 DIAGNOSIS — Z21 Asymptomatic human immunodeficiency virus [HIV] infection status: Secondary | ICD-10-CM

## 2019-04-06 DIAGNOSIS — Z79899 Other long term (current) drug therapy: Secondary | ICD-10-CM

## 2019-04-06 DIAGNOSIS — Z23 Encounter for immunization: Secondary | ICD-10-CM

## 2019-04-06 NOTE — Progress Notes (Signed)
RFV; follow up for hiv disease  Patient ID: Andrew GhaziMichael J Ehresman, male   DOB: [birth year], 48 y.o.   MRN: [id number]  HPI 48yo M with hiv disease, CD 4 count of 580/VL<20 on genvoya. He reports taking his meds daily not missing doses. Has been in good health. 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 He wears masks at work and when out and about. Not known to have covid exposure  Outpatient Encounter Medications as of 04/06/2019  Medication Sig  . GENVOYA 150-150-200-10 MG TABS tablet TAKE 1 TABLET BY MOUTH DAILY WITH BREAKFAST  . mometasone (ELOCON) 0.1 % cream Apply 1 application topically daily. (Patient not taking: Reported on 04/06/2019)  . Naproxen Sodium (ALEVE PO) Take 2 tablets by mouth 2 (two) times daily as needed (pain).   No facility-administered encounter medications on file as of 04/06/2019.      Patient Active Problem List   Diagnosis Date Noted  . Asymptomatic HIV infection (HCC) 04/19/2014  . 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 TETANUS/TDAP  03/13/1990  . INFLUENZA VACCINE  02/27/2019    Social History   Tobacco Use  . Smoking status: Never Smoker  . Smokeless tobacco: Never Used  Substance Use Topics  . Alcohol use: Yes    Alcohol/week: 0.0 standard drinks    Comment: occ  . Drug use: No   Review of Systems Review of Systems  Constitutional: Negative for fever, chills, diaphoresis, activity change, appetite change, fatigue and unexpected weight change.  HENT: Negative for congestion, sore throat, rhinorrhea, sneezing, trouble swallowing and sinus pressure.  Eyes: Negative for photophobia and visual disturbance.  Respiratory: Negative for cough, chest tightness, shortness of breath, wheezing and stridor.  Cardiovascular: Negative for chest pain, palpitations and leg swelling.  Gastrointestinal: Negative for nausea, vomiting, abdominal pain, diarrhea, constipation, blood in stool,  abdominal distention and anal bleeding.  Genitourinary: Negative for dysuria, hematuria, flank pain and difficulty urinating.  Musculoskeletal: Negative for myalgias, back pain, joint swelling, arthralgias and gait problem.  Skin: Negative for color change, pallor, rash and wound.  Neurological: Negative for dizziness, tremors, weakness and light-headedness.  Hematological: Negative for adenopathy. Does not bruise/bleed easily.  Psychiatric/Behavioral: Negative for behavioral problems, confusion, sleep disturbance, dysphoric mood, decreased concentration and agitation.     Physical Exam   BP 135/88   Pulse 71   Temp 98.6 F (37 C)   Physical Exam  Constitutional: He is oriented to person, place, and time. He appears well-developed and well-nourished. No distress.  HENT:  Mouth/Throat: Oropharynx is clear and moist. No oropharyngeal exudate.  Cardiovascular: Normal rate, regular rhythm and normal heart sounds. Exam reveals no gallop and no friction rub.  No murmur heard.  Pulmonary/Chest: Effort normal and breath sounds normal. No respiratory distress. He has no wheezes.  Lymphadenopathy:  He has no cervical adenopathy.  Neurological: He is alert and oriented to person, place, and time.  Skin: Skin is warm and dry. No rash noted. No erythema.  Psychiatric: He has a normal mood and affect. His behavior is normal.    Lab Results  Component Value Date   CD4TCELL 33 12/03/2018   Lab Results  Component Value Date   CD4TABS 580 12/03/2018   CD4TABS 410 06/09/2018   CD4TABS 600 12/02/2017   Lab Results  Component Value Date   HIV1RNAQUANT <20 NOT DETECTED 12/03/2018   Lab Results  Component Value Date  HEPBSAB POS (A) 03/31/2014   Lab Results  Component Value Date   LABRPR REACTIVE (A) 12/03/2018    CBC Lab Results  Component Value Date   WBC 5.4 12/03/2018   RBC 4.90 12/03/2018   HGB 15.5 12/03/2018   HCT 45.5 12/03/2018   PLT 229 12/03/2018   MCV 92.9  12/03/2018   MCH 31.6 12/03/2018   MCHC 34.1 12/03/2018   RDW 13.8 12/03/2018   LYMPHSABS 1,890 12/03/2018   MONOABS 504 11/26/2016   EOSABS 70 12/03/2018    BMET Lab Results  Component Value Date   NA 138 12/03/2018   K 4.8 12/03/2018   CL 103 12/03/2018   CO2 27 12/03/2018   GLUCOSE 91 12/03/2018   BUN 11 12/03/2018   CREATININE 1.18 12/03/2018   CALCIUM 10.2 12/03/2018   GFRNONAA 73 12/03/2018   GFRAA 85 12/03/2018      Assessment and Plan   hiv disease = cd 4 count well controlled continue on current regimen will check labs  Health maintenance = Flu vaccine today  Long term medicaiton management = will check ua

## 2019-04-07 LAB — URINALYSIS
Bilirubin Urine: NEGATIVE
Hgb urine dipstick: NEGATIVE
Ketones, ur: NEGATIVE
Leukocytes,Ua: NEGATIVE
Nitrite: NEGATIVE
Protein, ur: NEGATIVE
Specific Gravity, Urine: 1.011 (ref 1.001–1.03)
pH: 5.5 (ref 5.0–8.0)

## 2019-04-07 LAB — T-HELPER CELL (CD4) - (RCID CLINIC ONLY)
CD4 % Helper T Cell: 24 % — ABNORMAL LOW (ref 33–65)
CD4 T Cell Abs: 468 /uL (ref 400–1790)

## 2019-04-08 LAB — CBC WITH DIFFERENTIAL/PLATELET
Absolute Monocytes: 516 cells/uL (ref 200–950)
Basophils Absolute: 17 cells/uL (ref 0–200)
Basophils Relative: 0.3 %
Eosinophils Absolute: 58 cells/uL (ref 15–500)
Eosinophils Relative: 1 %
HCT: 43.7 % (ref 38.5–50.0)
Hemoglobin: 14.8 g/dL (ref 13.2–17.1)
Lymphs Abs: 2094 cells/uL (ref 850–3900)
MCH: 31.8 pg (ref 27.0–33.0)
MCHC: 33.9 g/dL (ref 32.0–36.0)
MCV: 93.8 fL (ref 80.0–100.0)
MPV: 12.1 fL (ref 7.5–12.5)
Monocytes Relative: 8.9 %
Neutro Abs: 3115 cells/uL (ref 1500–7800)
Neutrophils Relative %: 53.7 %
Platelets: 196 10*3/uL (ref 140–400)
RBC: 4.66 10*6/uL (ref 4.20–5.80)
RDW: 13.1 % (ref 11.0–15.0)
Total Lymphocyte: 36.1 %
WBC: 5.8 10*3/uL (ref 3.8–10.8)

## 2019-04-08 LAB — COMPLETE METABOLIC PANEL WITH GFR
AG Ratio: 1.6 (calc) (ref 1.0–2.5)
ALT: 56 U/L — ABNORMAL HIGH (ref 9–46)
AST: 40 U/L (ref 10–40)
Albumin: 4.4 g/dL (ref 3.6–5.1)
Alkaline phosphatase (APISO): 60 U/L (ref 36–130)
BUN: 8 mg/dL (ref 7–25)
CO2: 26 mmol/L (ref 20–32)
Calcium: 9.6 mg/dL (ref 8.6–10.3)
Chloride: 105 mmol/L (ref 98–110)
Creat: 1.13 mg/dL (ref 0.60–1.35)
GFR, Est African American: 89 mL/min/{1.73_m2} (ref >=60)
GFR, Est Non African American: 76 mL/min/{1.73_m2} (ref >=60)
Globulin: 2.8 g/dL (calc) (ref 1.9–3.7)
Glucose, Bld: 140 mg/dL — ABNORMAL HIGH (ref 65–99)
Potassium: 4.2 mmol/L (ref 3.5–5.3)
Sodium: 139 mmol/L (ref 135–146)
Total Bilirubin: 0.4 mg/dL (ref 0.2–1.2)
Total Protein: 7.2 g/dL (ref 6.1–8.1)

## 2019-04-08 LAB — HIV-1 RNA QUANT-NO REFLEX-BLD
HIV 1 RNA Quant: <20 copies/mL
HIV-1 RNA Quant, Log: <1.3 Log copies/mL

## 2019-04-08 LAB — URINE CYTOLOGY ANCILLARY ONLY
Chlamydia: NEGATIVE
Neisseria Gonorrhea: NEGATIVE

## 2019-05-29 ENCOUNTER — Other Ambulatory Visit: Payer: Self-pay

## 2019-05-29 DIAGNOSIS — Z20822 Contact with and (suspected) exposure to covid-19: Secondary | ICD-10-CM

## 2019-05-30 LAB — NOVEL CORONAVIRUS, NAA: SARS-CoV-2, NAA: NOT DETECTED

## 2019-07-22 ENCOUNTER — Other Ambulatory Visit: Payer: Self-pay | Admitting: Internal Medicine

## 2019-07-22 DIAGNOSIS — B20 Human immunodeficiency virus [HIV] disease: Secondary | ICD-10-CM

## 2019-08-09 ENCOUNTER — Other Ambulatory Visit (HOSPITAL_COMMUNITY)
Admission: RE | Admit: 2019-08-09 | Discharge: 2019-08-09 | Disposition: A | Discharge status: Home / Self Care | Payer: BC Managed Care – PPO | Source: Ambulatory Visit | Attending: Internal Medicine | Admitting: Internal Medicine

## 2019-08-09 ENCOUNTER — Other Ambulatory Visit: Payer: Self-pay

## 2019-08-09 ENCOUNTER — Ambulatory Visit (INDEPENDENT_AMBULATORY_CARE_PROVIDER_SITE_OTHER): Payer: BC Managed Care – PPO | Admitting: Internal Medicine

## 2019-08-09 ENCOUNTER — Encounter: Payer: Self-pay | Admitting: Internal Medicine

## 2019-08-09 VITALS — BP 132/87 | HR 66 | Temp 98.0°F | Wt 142.0 lb

## 2019-08-09 DIAGNOSIS — B20 Human immunodeficiency virus [HIV] disease: Secondary | ICD-10-CM

## 2019-08-09 DIAGNOSIS — F5101 Primary insomnia: Secondary | ICD-10-CM

## 2019-08-09 DIAGNOSIS — R635 Abnormal weight gain: Secondary | ICD-10-CM | POA: Diagnosis not present

## 2019-08-09 LAB — URINALYSIS
Bilirubin Urine: NEGATIVE
Hgb urine dipstick: NEGATIVE
Ketones, ur: NEGATIVE
Leukocytes,Ua: NEGATIVE
Nitrite: NEGATIVE
Protein, ur: NEGATIVE
Specific Gravity, Urine: 1.01 (ref 1.001–1.03)
pH: <=5 (ref 5.0–8.0)

## 2019-08-09 NOTE — Progress Notes (Signed)
Rfv: follow up for hiv disease  Patient ID: Andrew Potter, male   DOB: August 23, 1970, 49 y.o.   MRN: [id number]  HPI  49yo M with well controlled hiv disease, CD 4 count of 468/VL<20, not missed doses - has some insomnia and nocturnal eating. Up for 5 lb. Otherwise in good health. Denies any recent covid exposure. No other health illnesses Outpatient Encounter Medications as of 08/09/2019  Medication Sig  . GENVOYA 150-150-200-10 MG TABS tablet TAKE 1 TABLET BY MOUTH DAILY WITH BREAKFAST  . mometasone (ELOCON) 0.1 % cream Apply 1 application topically daily.  . Naproxen Sodium (ALEVE PO) Take 2 tablets by mouth 2 (two) times daily as needed (pain).   No facility-administered encounter medications on file as of 08/09/2019.     Patient Active Problem List   Diagnosis Date Noted  . Asymptomatic HIV infection (HCC) 04/19/2014  . 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 Janet Berlin  03/13/1990    Social History   Tobacco Use  . Smoking status: Never Smoker  . Smokeless tobacco: Never Used  Substance Use Topics  . Alcohol use: Yes    Alcohol/week: 0.0 standard drinks    Comment: occ  . Drug use: No    Review of Systems Review of Systems  Constitutional + weight gain. : Negative for fever, chills, diaphoresis, activity change, appetite change, fatigue HENT: Negative for congestion, sore throat, rhinorrhea, sneezing, trouble swallowing and sinus pressure.  Eyes: Negative for photophobia and visual disturbance.  Respiratory: Negative for cough, chest tightness, shortness of breath, wheezing and stridor.  Cardiovascular: Negative for chest pain, palpitations and leg swelling.  Gastrointestinal: Negative for nausea, vomiting, abdominal pain, diarrhea, constipation, blood in stool, abdominal distention and anal bleeding.  Genitourinary: Negative for dysuria, hematuria, flank pain and difficulty urinating.  Musculoskeletal:  Negative for myalgias, back pain, joint swelling, arthralgias and gait problem.  enuo = insomia + Hematological: Negative for adenopathy. Does not bruise/bleed easily.  Psychiatric/Behavioral: Negative for behavioral problems, confusion, sleep disturbance, dysphoric mood, decreased concentration and agitation.    Physical Exam   BP 132/87   Pulse 66   Temp 98 F (36.7 C)   Wt 142 lb (64.4 kg)   BMI 25.15 kg/m    Lab Results  Component Value Date   CD4TCELL 24 (L) 04/06/2019   Lab Results  Component Value Date   CD4TABS 468 04/06/2019   CD4TABS 580 12/03/2018   CD4TABS 410 06/09/2018   Lab Results  Component Value Date   HIV1RNAQUANT <20 NOT DETECTED 04/06/2019   Lab Results  Component Value Date   HEPBSAB POS (A) 03/31/2014   Lab Results  Component Value Date   LABRPR REACTIVE (A) 12/03/2018    CBC Lab Results  Component Value Date   WBC 5.8 04/06/2019   RBC 4.66 04/06/2019   HGB 14.8 04/06/2019   HCT 43.7 04/06/2019   PLT 196 04/06/2019   MCV 93.8 04/06/2019   MCH 31.8 04/06/2019   MCHC 33.9 04/06/2019   RDW 13.1 04/06/2019   LYMPHSABS 2,094 04/06/2019   MONOABS 504 11/26/2016   EOSABS 58 04/06/2019    BMET Lab Results  Component Value Date   NA 139 04/06/2019   K 4.2 04/06/2019   CL 105 04/06/2019   CO2 26 04/06/2019   GLUCOSE 140 (H) 04/06/2019   BUN 8 04/06/2019   CREATININE 1.13 04/06/2019   CALCIUM 9.6 04/06/2019   GFRNONAA 76 04/06/2019  GFRAA 89 04/06/2019      Assessment and Plan hiv disease = will check labs today  Insomnia =  Sleep hygeine otc melatonin, sleepytime teas -   Weight gain = likely tied to eating in the middle of the night  covid vaccine recommended when his time comes up

## 2019-08-10 LAB — URINE CYTOLOGY ANCILLARY ONLY
Chlamydia: NEGATIVE
Comment: NEGATIVE
Comment: NORMAL
Neisseria Gonorrhea: NEGATIVE

## 2019-08-10 LAB — T-HELPER CELL (CD4) - (RCID CLINIC ONLY)
CD4 % Helper T Cell: 24 % — ABNORMAL LOW (ref 33–65)
CD4 T Cell Abs: 431 /uL (ref 400–1790)

## 2019-08-14 LAB — CBC WITH DIFFERENTIAL/PLATELET
Absolute Monocytes: 400 cells/uL (ref 200–950)
Basophils Absolute: 10 cells/uL (ref 0–200)
Basophils Relative: 0.2 %
Eosinophils Absolute: 88 cells/uL (ref 15–500)
Eosinophils Relative: 1.7 %
HCT: 42.8 % (ref 38.5–50.0)
Hemoglobin: 14.5 g/dL (ref 13.2–17.1)
Lymphs Abs: 1841 cells/uL (ref 850–3900)
MCH: 32.4 pg (ref 27.0–33.0)
MCHC: 33.9 g/dL (ref 32.0–36.0)
MCV: 95.5 fL (ref 80.0–100.0)
MPV: 12.5 fL (ref 7.5–12.5)
Monocytes Relative: 7.7 %
Neutro Abs: 2860 cells/uL (ref 1500–7800)
Neutrophils Relative %: 55 %
Platelets: 189 10*3/uL (ref 140–400)
RBC: 4.48 10*6/uL (ref 4.20–5.80)
RDW: 12.6 % (ref 11.0–15.0)
Total Lymphocyte: 35.4 %
WBC: 5.2 10*3/uL (ref 3.8–10.8)

## 2019-08-14 LAB — COMPLETE METABOLIC PANEL WITH GFR
AG Ratio: 1.5 (calc) (ref 1.0–2.5)
ALT: 60 U/L — ABNORMAL HIGH (ref 9–46)
AST: 54 U/L — ABNORMAL HIGH (ref 10–40)
Albumin: 4.4 g/dL (ref 3.6–5.1)
Alkaline phosphatase (APISO): 59 U/L (ref 36–130)
BUN: 9 mg/dL (ref 7–25)
CO2: 26 mmol/L (ref 20–32)
Calcium: 9.5 mg/dL (ref 8.6–10.3)
Chloride: 103 mmol/L (ref 98–110)
Creat: 1.19 mg/dL (ref 0.60–1.35)
GFR, Est African American: 83 mL/min/{1.73_m2} (ref >=60)
GFR, Est Non African American: 72 mL/min/{1.73_m2} (ref >=60)
Globulin: 3 g/dL (calc) (ref 1.9–3.7)
Glucose, Bld: 251 mg/dL — ABNORMAL HIGH (ref 65–99)
Potassium: 4.3 mmol/L (ref 3.5–5.3)
Sodium: 136 mmol/L (ref 135–146)
Total Bilirubin: 0.4 mg/dL (ref 0.2–1.2)
Total Protein: 7.4 g/dL (ref 6.1–8.1)

## 2019-08-14 LAB — RPR TITER: RPR Titer: 1:1 {titer} — ABNORMAL HIGH

## 2019-08-14 LAB — HIV-1 RNA QUANT-NO REFLEX-BLD
HIV 1 RNA Quant: <20 copies/mL
HIV-1 RNA Quant, Log: <1.3 Log copies/mL

## 2019-08-14 LAB — RPR: RPR Ser Ql: REACTIVE — AB

## 2019-08-14 LAB — FLUORESCENT TREPONEMAL AB(FTA)-IGG-BLD: Fluorescent Treponemal ABS: REACTIVE — AB

## 2019-11-17 ENCOUNTER — Other Ambulatory Visit: Payer: Self-pay | Admitting: Internal Medicine

## 2019-11-17 DIAGNOSIS — B20 Human immunodeficiency virus [HIV] disease: Secondary | ICD-10-CM

## 2019-12-17 ENCOUNTER — Encounter: Payer: Self-pay | Admitting: Internal Medicine

## 2020-02-07 ENCOUNTER — Other Ambulatory Visit: Payer: Self-pay

## 2020-02-07 ENCOUNTER — Encounter: Payer: Self-pay | Admitting: Internal Medicine

## 2020-02-07 ENCOUNTER — Ambulatory Visit: Payer: BC Managed Care – PPO | Admitting: Internal Medicine

## 2020-02-07 ENCOUNTER — Other Ambulatory Visit (HOSPITAL_COMMUNITY)
Admission: RE | Admit: 2020-02-07 | Discharge: 2020-02-07 | Disposition: A | Discharge status: Home / Self Care | Payer: BC Managed Care – PPO | Source: Ambulatory Visit | Attending: Internal Medicine | Admitting: Internal Medicine

## 2020-02-07 VITALS — BP 125/76 | HR 72 | Wt 139.0 lb

## 2020-02-07 DIAGNOSIS — R739 Hyperglycemia, unspecified: Secondary | ICD-10-CM | POA: Diagnosis present

## 2020-02-07 DIAGNOSIS — Z79899 Other long term (current) drug therapy: Secondary | ICD-10-CM | POA: Diagnosis not present

## 2020-02-07 DIAGNOSIS — B20 Human immunodeficiency virus [HIV] disease: Secondary | ICD-10-CM | POA: Diagnosis present

## 2020-02-07 DIAGNOSIS — R7309 Other abnormal glucose: Secondary | ICD-10-CM | POA: Diagnosis not present

## 2020-02-07 NOTE — Patient Instructions (Signed)
  [email or web address]

## 2020-02-07 NOTE — Progress Notes (Signed)
RFV: follow up for hiv disease  Patient ID: Andrew Potter, male   DOB: [birth year], 49 y.o.   MRN: [id number]  HPI 49yo M with hiv disease on genvoya.he reports excellent adherence. Has been in good health. he  Received COVID vaccine through [facility] - moderna #1 lot number [id number] on 10/31/19, moderna #2, lot # [id number], on 12/01/19  5 day mini vacation. Going to [place] to see friends this weekend who are also vaccinated  Outpatient Encounter Medications as of 02/07/2020  Medication Sig  . GENVOYA 150-150-200-10 MG TABS tablet TAKE 1 TABLET BY MOUTH DAILY WITH BREAKFAST  . Naproxen Sodium (ALEVE PO) Take 2 tablets by mouth 2 (two) times daily as needed (pain).  . mometasone (ELOCON) 0.1 % cream Apply 1 application topically daily. (Patient not taking: Reported on 02/07/2020)   No facility-administered encounter medications on file as of 02/07/2020.     Patient Active Problem List   Diagnosis Date Noted  . Asymptomatic HIV infection (HCC) 04/19/2014  . 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 COVID-19 Vaccine (1) Never done  . TETANUS/TDAP  Never done     Review of Systems Review of Systems  Constitutional: Negative for fever, chills, diaphoresis, activity change, appetite change, fatigue and unexpected weight change.  HENT: Negative for congestion, sore throat, rhinorrhea, sneezing, trouble swallowing and sinus pressure.  Eyes: Negative for photophobia and visual disturbance.  Respiratory: Negative for cough, chest tightness, shortness of breath, wheezing and stridor.  Cardiovascular: Negative for chest pain, palpitations and leg swelling.  Gastrointestinal: Negative for nausea, vomiting, abdominal pain, diarrhea, constipation, blood in stool, abdominal distention and anal bleeding.  Genitourinary: Negative for dysuria, hematuria, flank pain and difficulty urinating.  Musculoskeletal: Negative for myalgias, back pain, joint  swelling, arthralgias and gait problem.  Skin: Negative for color change, pallor, rash and wound.  Neurological: Negative for dizziness, tremors, weakness and light-headedness.  Hematological: Negative for adenopathy. Does not bruise/bleed easily.  Psychiatric/Behavioral: Negative for behavioral problems, confusion, sleep disturbance, dysphoric mood, decreased concentration and agitation.    Physical Exam   BP 125/76   Pulse 72   Wt 139 lb (63 kg)   SpO2 99%   BMI 24.62 kg/m   Physical Exam  Constitutional: He is oriented to person, place, and time. He appears well-developed and well-nourished. No distress.  HENT:  Mouth/Throat: Oropharynx is clear and moist. No oropharyngeal exudate.  Cardiovascular: Normal rate, regular rhythm and normal heart sounds. Exam reveals no gallop and no friction rub.  No murmur heard.  Pulmonary/Chest: Effort normal and breath sounds normal. No respiratory distress. He has no wheezes.  Abdominal: Soft. Bowel sounds are normal. He exhibits no distension. There is no tenderness.  Lymphadenopathy:  He has no cervical adenopathy.  Neurological: He is alert and oriented to person, place, and time.  Skin: Skin is warm and dry. No rash noted. No erythema.  Psychiatric: He has a normal mood and affect. His behavior is normal.    Lab Results  Component Value Date   CD4TCELL 24 (L) 08/09/2019   Lab Results  Component Value Date   CD4TABS 431 08/09/2019   CD4TABS 468 04/06/2019   CD4TABS 580 12/03/2018   Lab Results  Component Value Date   HIV1RNAQUANT <20 NOT DETECTED 08/09/2019   Lab Results  Component Value Date   HEPBSAB POS (A) 03/31/2014   Lab Results  Component Value Date   LABRPR REACTIVE (A) 08/09/2019  CBC Lab Results  Component Value Date   WBC 5.2 08/09/2019   RBC 4.48 08/09/2019   HGB 14.5 08/09/2019   HCT 42.8 08/09/2019   PLT 189 08/09/2019   MCV 95.5 08/09/2019   MCH 32.4 08/09/2019   MCHC 33.9 08/09/2019   RDW 12.6  08/09/2019   LYMPHSABS 1,841 08/09/2019   MONOABS 504 11/26/2016   EOSABS 88 08/09/2019    BMET Lab Results  Component Value Date   NA 136 08/09/2019   K 4.3 08/09/2019   CL 103 08/09/2019   CO2 26 08/09/2019   GLUCOSE 251 (H) 08/09/2019   BUN 9 08/09/2019   CREATININE 1.19 08/09/2019   CALCIUM 9.5 08/09/2019   GFRNONAA 72 08/09/2019   GFRAA 83 08/09/2019    Assessment and Plan  hiv disease = well controlled in January 2021. Will recehck labs  Long term medication management = will check cr but has been stable  Health maintenance = flu vaccine this fall.  Hyperglycemia = will check hgba1c - concern for diabetes

## 2020-02-08 LAB — T-HELPER CELL (CD4) - (RCID CLINIC ONLY)
CD4 % Helper T Cell: 29 % — ABNORMAL LOW (ref 33–65)
CD4 T Cell Abs: 627 /uL (ref 400–1790)

## 2020-02-08 LAB — URINE CYTOLOGY ANCILLARY ONLY
Chlamydia: NEGATIVE
Comment: NEGATIVE
Comment: NORMAL
Neisseria Gonorrhea: NEGATIVE

## 2020-02-11 LAB — CBC WITH DIFFERENTIAL/PLATELET
Absolute Monocytes: 601 cells/uL (ref 200–950)
Basophils Absolute: 19 cells/uL (ref 0–200)
Basophils Relative: 0.3 %
Eosinophils Absolute: 118 cells/uL (ref 15–500)
Eosinophils Relative: 1.9 %
HCT: 43.9 % (ref 38.5–50.0)
Hemoglobin: 15.1 g/dL (ref 13.2–17.1)
Lymphs Abs: 2213 cells/uL (ref 850–3900)
MCH: 32.3 pg (ref 27.0–33.0)
MCHC: 34.4 g/dL (ref 32.0–36.0)
MCV: 93.8 fL (ref 80.0–100.0)
MPV: 12.3 fL (ref 7.5–12.5)
Monocytes Relative: 9.7 %
Neutro Abs: 3249 cells/uL (ref 1500–7800)
Neutrophils Relative %: 52.4 %
Platelets: 199 10*3/uL (ref 140–400)
RBC: 4.68 10*6/uL (ref 4.20–5.80)
RDW: 12.6 % (ref 11.0–15.0)
Total Lymphocyte: 35.7 %
WBC: 6.2 10*3/uL (ref 3.8–10.8)

## 2020-02-11 LAB — HEMOGLOBIN A1C
Hgb A1c MFr Bld: 7.6 % of total Hgb — ABNORMAL HIGH (ref <5.7)
Mean Plasma Glucose: 171 (calc)
eAG (mmol/L): 9.5 (calc)

## 2020-02-11 LAB — COMPLETE METABOLIC PANEL WITH GFR
AG Ratio: 1.3 (calc) (ref 1.0–2.5)
ALT: 66 U/L — ABNORMAL HIGH (ref 9–46)
AST: 45 U/L — ABNORMAL HIGH (ref 10–40)
Albumin: 4.4 g/dL (ref 3.6–5.1)
Alkaline phosphatase (APISO): 73 U/L (ref 36–130)
BUN: 12 mg/dL (ref 7–25)
CO2: 28 mmol/L (ref 20–32)
Calcium: 9.7 mg/dL (ref 8.6–10.3)
Chloride: 100 mmol/L (ref 98–110)
Creat: 1.14 mg/dL (ref 0.60–1.35)
GFR, Est African American: 88 mL/min/{1.73_m2} (ref >=60)
GFR, Est Non African American: 76 mL/min/{1.73_m2} (ref >=60)
Globulin: 3.3 g/dL (calc) (ref 1.9–3.7)
Glucose, Bld: 193 mg/dL — ABNORMAL HIGH (ref 65–99)
Potassium: 4.3 mmol/L (ref 3.5–5.3)
Sodium: 136 mmol/L (ref 135–146)
Total Bilirubin: 0.5 mg/dL (ref 0.2–1.2)
Total Protein: 7.7 g/dL (ref 6.1–8.1)

## 2020-02-11 LAB — URINALYSIS
Bilirubin Urine: NEGATIVE
Hgb urine dipstick: NEGATIVE
Ketones, ur: NEGATIVE
Leukocytes,Ua: NEGATIVE
Nitrite: NEGATIVE
Protein, ur: NEGATIVE
Specific Gravity, Urine: 1.012 (ref 1.001–1.03)
pH: <=5 (ref 5.0–8.0)

## 2020-02-11 LAB — LIPID PANEL
Cholesterol: 127 mg/dL (ref <200)
HDL: 47 mg/dL (ref >=40)
LDL Cholesterol (Calc): 55 mg/dL (calc)
Non-HDL Cholesterol (Calc): 80 mg/dL (calc) (ref <130)
Total CHOL/HDL Ratio: 2.7 (calc) (ref <5.0)
Triglycerides: 168 mg/dL — ABNORMAL HIGH (ref <150)

## 2020-02-11 LAB — HIV-1 RNA QUANT-NO REFLEX-BLD
HIV 1 RNA Quant: <20 copies/mL
HIV-1 RNA Quant, Log: <1.3 Log copies/mL

## 2020-02-11 LAB — FLUORESCENT TREPONEMAL AB(FTA)-IGG-BLD: Fluorescent Treponemal ABS: REACTIVE — AB

## 2020-02-11 LAB — RPR TITER: RPR Titer: 1:1 {titer} — ABNORMAL HIGH

## 2020-02-11 LAB — RPR: RPR Ser Ql: REACTIVE — AB

## 2020-03-18 ENCOUNTER — Other Ambulatory Visit: Payer: Self-pay | Admitting: Internal Medicine

## 2020-03-18 DIAGNOSIS — B20 Human immunodeficiency virus [HIV] disease: Secondary | ICD-10-CM

## 2020-05-01 ENCOUNTER — Other Ambulatory Visit: Payer: Self-pay

## 2020-05-01 ENCOUNTER — Encounter: Payer: Self-pay | Admitting: Internal Medicine

## 2020-05-01 ENCOUNTER — Ambulatory Visit: Payer: BC Managed Care – PPO

## 2020-05-01 ENCOUNTER — Ambulatory Visit (INDEPENDENT_AMBULATORY_CARE_PROVIDER_SITE_OTHER): Payer: BC Managed Care – PPO | Admitting: Internal Medicine

## 2020-05-01 VITALS — BP 123/80 | HR 67 | Temp 98.0°F | Ht 63.0 in | Wt 133.0 lb

## 2020-05-01 DIAGNOSIS — R739 Hyperglycemia, unspecified: Secondary | ICD-10-CM | POA: Diagnosis not present

## 2020-05-01 DIAGNOSIS — B20 Human immunodeficiency virus [HIV] disease: Secondary | ICD-10-CM | POA: Diagnosis not present

## 2020-05-01 DIAGNOSIS — F4320 Adjustment disorder, unspecified: Secondary | ICD-10-CM

## 2020-05-01 NOTE — Progress Notes (Signed)
RFV: follow up for hiv disease  Patient ID: Andrew Potter, male   DOB: 1971-04-10, 49 y.o.   MRN: [id number]  HPI Andrew Potter is a 49yo M with well controlled hiv disease, He takes genvoya daily. 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 Has become increasingly anxious this year during pandemic and concern for his own health.    Outpatient Encounter Medications as of 05/01/2020  Medication Sig  . GENVOYA 150-150-200-10 MG TABS tablet TAKE 1 TABLET BY MOUTH DAILY WITH BREAKFAST  . Naproxen Sodium (ALEVE PO) Take 2 tablets by mouth 2 (two) times daily as needed (pain).  . mometasone (ELOCON) 0.1 % cream Apply 1 application topically daily. (Patient not taking: Reported on 05/01/2020)   No facility-administered encounter medications on file as of 05/01/2020.     Patient Active Problem List   Diagnosis Date Noted  . Asymptomatic HIV infection (HCC) 04/19/2014  . 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 TETANUS/TDAP  Never done  . INFLUENZA VACCINE  02/27/2020     Review of Systems Review of Systems  Constitutional: Negative for fever, chills, diaphoresis, activity change, appetite change, fatigue and unexpected weight change.  HENT: Negative for congestion, sore throat, rhinorrhea, sneezing, trouble swallowing and sinus pressure.  Eyes: Negative for photophobia and visual disturbance.  Respiratory: Negative for cough, chest tightness, shortness of breath, wheezing and stridor.  Cardiovascular: Negative for chest pain, palpitations and leg swelling.  Gastrointestinal: Negative for nausea, vomiting, abdominal pain, diarrhea, constipation, blood in stool, abdominal distention and anal bleeding.  Genitourinary: Negative for dysuria, hematuria, flank pain and difficulty urinating.  Musculoskeletal: Negative for myalgias, back pain, joint  swelling, arthralgias and gait problem.  Skin: Negative for color change, pallor, rash and wound.  Neurological: Negative for dizziness, tremors, weakness and light-headedness.  Hematological: Negative for adenopathy. Does not bruise/bleed easily.  Psychiatric/Behavioral: +anxiety   Physical Exam   BP 123/80   Pulse 67   Temp 98 F (36.7 C)   Ht 5\' 3"  (1.6 m)   Wt 133 lb (60.3 kg)   BMI 23.56 kg/m   Physical Exam  Constitutional: He is oriented to person, place, and time. He appears well-developed and well-nourished. No distress.  HENT:  Mouth/Throat: Oropharynx is clear and moist. No oropharyngeal exudate.  Cardiovascular: Normal rate, regular rhythm and normal heart sounds. Exam reveals no gallop and no friction rub.  No murmur heard.  Pulmonary/Chest: Effort normal and breath sounds normal. No respiratory distress. He has no wheezes.  Abdominal: Soft. Bowel sounds are normal. He exhibits no distension. There is no tenderness.  Lymphadenopathy:  He has no cervical adenopathy.  Neurological: He is alert and oriented to person, place, and time.  Skin: Skin is warm and dry. No rash noted. No erythema.  Psychiatric: He has a normal mood and affect. His behavior is normal.    Lab Results  Component Value Date   CD4TCELL 29 (L) 02/07/2020   Lab Results  Component Value Date   CD4TABS 627 02/07/2020   CD4TABS 431 08/09/2019   CD4TABS 468 04/06/2019   Lab Results  Component Value Date   HIV1RNAQUANT <20 NOT DETECTED 02/07/2020   Lab Results  Component Value Date   HEPBSAB POS (A) 03/31/2014   Lab Results  Component Value Date   LABRPR REACTIVE (A) 02/07/2020  CBC Lab Results  Component Value Date   WBC 6.2 02/07/2020   RBC 4.68 02/07/2020   HGB 15.1 02/07/2020   HCT 43.9 02/07/2020   PLT 199 02/07/2020   MCV 93.8 02/07/2020   MCH 32.3 02/07/2020   MCHC 34.4 02/07/2020   RDW 12.6 02/07/2020   LYMPHSABS 2,213 02/07/2020   MONOABS 504 11/26/2016   EOSABS  118 02/07/2020    BMET Lab Results  Component Value Date   NA 136 02/07/2020   K 4.3 02/07/2020   CL 100 02/07/2020   CO2 28 02/07/2020   GLUCOSE 193 (H) 02/07/2020   BUN 12 02/07/2020   CREATININE 1.14 02/07/2020   CALCIUM 9.7 02/07/2020   GFRNONAA 76 02/07/2020   GFRAA 88 02/07/2020      Assessment and Plan  Health Maintenance = will flu shot  Pre-diabetic stage = wanted to do diet modification. Has been working on it. Check hgb1ac  hiv disease = looks great.  Stress = discussed getting booster and also  Wearing more durable mask such as [id number] black mask.

## 2020-05-02 LAB — HEMOGLOBIN A1C
Hgb A1c MFr Bld: 7.3 % of total Hgb — ABNORMAL HIGH (ref <5.7)
Mean Plasma Glucose: 163 (calc)
eAG (mmol/L): 9 (calc)

## 2020-06-16 ENCOUNTER — Other Ambulatory Visit: Payer: Self-pay | Admitting: Internal Medicine

## 2020-06-16 DIAGNOSIS — B20 Human immunodeficiency virus [HIV] disease: Secondary | ICD-10-CM

## 2020-07-04 ENCOUNTER — Encounter: Payer: Self-pay | Admitting: Internal Medicine

## 2020-09-04 ENCOUNTER — Encounter: Payer: Self-pay | Admitting: Internal Medicine

## 2020-09-04 ENCOUNTER — Ambulatory Visit (INDEPENDENT_AMBULATORY_CARE_PROVIDER_SITE_OTHER): Payer: BC Managed Care – PPO | Admitting: Internal Medicine

## 2020-09-04 ENCOUNTER — Other Ambulatory Visit: Payer: Self-pay

## 2020-09-04 VITALS — BP 140/88 | HR 71 | Temp 97.9°F | Wt 139.4 lb

## 2020-09-04 DIAGNOSIS — Z23 Encounter for immunization: Secondary | ICD-10-CM | POA: Diagnosis not present

## 2020-09-04 DIAGNOSIS — R7309 Other abnormal glucose: Secondary | ICD-10-CM

## 2020-09-04 DIAGNOSIS — F419 Anxiety disorder, unspecified: Secondary | ICD-10-CM | POA: Diagnosis not present

## 2020-09-04 DIAGNOSIS — B20 Human immunodeficiency virus [HIV] disease: Secondary | ICD-10-CM | POA: Diagnosis not present

## 2020-09-04 NOTE — Progress Notes (Signed)
RFV: follow up for hiv disease   Patient ID: Andrew Potter, Andrew Potter   DOB: 1970-10-08, 50 y.o.   MRN: [id number]  HPI 49yo m with well controlled hiv disease, currently on genvoya. Doing well with medicines. He reports that it is Stressful at work, still. Had booster with [facility] 3 weeks ago - uptodate on covid vaccine. Has not had covid. 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 He has not had recent illness  Outpatient Encounter Medications as of 09/04/2020  Medication Sig  . GENVOYA 150-150-200-10 MG TABS tablet TAKE 1 TABLET BY MOUTH DAILY WITH BREAKFAST  . Naproxen Sodium (ALEVE PO) Take 2 tablets by mouth 2 (two) times daily as needed (pain).  . mometasone (ELOCON) 0.1 % cream Apply 1 application topically daily. (Patient not taking: No sig reported)   No facility-administered encounter medications on file as of 09/04/2020.     Patient Active Problem List   Diagnosis Date Noted  . Asymptomatic HIV infection (HCC) 04/19/2014  . 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 TETANUS/TDAP  Never done  . COLONOSCOPY (Pts 45-41yrs Insurance coverage will need to be confirmed)  Never done  . COVID-19 Vaccine (3 - Moderna risk 4-dose series) 12/29/2019  . INFLUENZA VACCINE  02/27/2020    Social History   Tobacco Use  . Smoking status: Never Smoker  . Smokeless tobacco: Never Used  Vaping Use  . Vaping Use: Never used  Substance Use Topics  . Alcohol use: Yes    Alcohol/week: 0.0 standard drinks    Comment: occ  . Drug use: No   Review of Systems Physical Exam  Constitutional: He is oriented to person, place, and time. He appears well-developed and well-nourished. No distress.  HENT:  Mouth/Throat: Oropharynx is clear and moist. No oropharyngeal exudate.  Cardiovascular: Normal rate, regular rhythm and normal heart sounds. Exam reveals no gallop and no friction rub.  No murmur heard.  Pulmonary/Chest: Effort normal  and breath sounds normal. No respiratory distress. He has no wheezes.  Abdominal: Soft. Bowel sounds are normal. He exhibits no distension. There is no tenderness.  Lymphadenopathy:  He has no cervical adenopathy.  Neurological: He is alert and oriented to person, place, and time.  Skin: Skin is warm and dry. No rash noted. No erythema.  Psychiatric: He has a normal mood and affect. His behavior is normal.    Physical Exam   BP 140/88   Pulse 71   Temp 97.9 F (36.6 C) (Oral)   Wt 139 lb 6.4 oz (63.2 kg)   BMI 24.69 kg/m   Physical Exam  Constitutional: He is oriented to person, place, and time. He appears well-developed and well-nourished. No distress.  HENT:  Mouth/Throat: Oropharynx is clear and moist. No oropharyngeal exudate.  Cardiovascular: Normal rate, regular rhythm and normal heart sounds. Exam reveals no gallop and no friction rub.  No murmur heard.  Pulmonary/Chest: Effort normal and breath sounds normal. No respiratory distress. He has no wheezes.  Abdominal: Soft. Bowel sounds are normal. He exhibits no distension. There is no tenderness.  Lymphadenopathy:  He has no cervical adenopathy.  Neurological: He is alert and oriented to person, place, and time.  Skin: Skin is warm and dry. No rash noted. No erythema.  Psychiatric: He has a normal mood and affect. His behavior is normal.    Lab Results  Component Value Date   CD4TCELL 29 (L) 02/07/2020  Lab Results  Component Value Date   CD4TABS 627 02/07/2020   CD4TABS 431 08/09/2019   CD4TABS 468 04/06/2019   Lab Results  Component Value Date   HIV1RNAQUANT <20 NOT DETECTED 02/07/2020   Lab Results  Component Value Date   HEPBSAB POS (A) 03/31/2014   Lab Results  Component Value Date   LABRPR REACTIVE (A) 02/07/2020    CBC Lab Results  Component Value Date   WBC 6.2 02/07/2020   RBC 4.68 02/07/2020   HGB 15.1 02/07/2020   HCT 43.9 02/07/2020   PLT 199 02/07/2020   MCV 93.8 02/07/2020   MCH  32.3 02/07/2020   MCHC 34.4 02/07/2020   RDW 12.6 02/07/2020   LYMPHSABS 2,213 02/07/2020   MONOABS 504 11/26/2016   EOSABS 118 02/07/2020    BMET Lab Results  Component Value Date   NA 136 02/07/2020   K 4.3 02/07/2020   CL 100 02/07/2020   CO2 28 02/07/2020   GLUCOSE 193 (H) 02/07/2020   BUN 12 02/07/2020   CREATININE 1.14 02/07/2020   CALCIUM 9.7 02/07/2020   GFRNONAA 76 02/07/2020   GFRAA 88 02/07/2020      Assessment and Plan  hiv disease= previously well controlled. Has good adherence. We will check labs.  Hyperglycemia = will check hemoglobin a1c.   Anxiety = stress for work has now been noted in 2 visits. Offered counseling. If still persists, consider starting anti-anxiety medication like fluoxetine. He is not interested at this time.  Health maintenance = figure out how to get set up for colonoscopy. Will check hemoglobin a1c. Has received flu shot today.

## 2020-09-05 LAB — T-HELPER CELL (CD4) - (RCID CLINIC ONLY)
CD4 % Helper T Cell: 30 % — ABNORMAL LOW (ref 33–65)
CD4 T Cell Abs: 588 /uL (ref 400–1790)

## 2020-09-07 LAB — COMPLETE METABOLIC PANEL WITH GFR
AG Ratio: 1.3 (calc) (ref 1.0–2.5)
ALT: 51 U/L — ABNORMAL HIGH (ref 9–46)
AST: 38 U/L (ref 10–40)
Albumin: 4.4 g/dL (ref 3.6–5.1)
Alkaline phosphatase (APISO): 64 U/L (ref 36–130)
BUN: 12 mg/dL (ref 7–25)
CO2: 26 mmol/L (ref 20–32)
Calcium: 10 mg/dL (ref 8.6–10.3)
Chloride: 103 mmol/L (ref 98–110)
Creat: 1.06 mg/dL (ref 0.60–1.35)
GFR, Est African American: 95 mL/min/{1.73_m2} (ref >=60)
GFR, Est Non African American: 82 mL/min/{1.73_m2} (ref >=60)
Globulin: 3.3 g/dL (calc) (ref 1.9–3.7)
Glucose, Bld: 157 mg/dL — ABNORMAL HIGH (ref 65–99)
Potassium: 4.7 mmol/L (ref 3.5–5.3)
Sodium: 137 mmol/L (ref 135–146)
Total Bilirubin: 0.3 mg/dL (ref 0.2–1.2)
Total Protein: 7.7 g/dL (ref 6.1–8.1)

## 2020-09-07 LAB — CBC WITH DIFFERENTIAL/PLATELET
Absolute Monocytes: 673 cells/uL (ref 200–950)
Basophils Absolute: 17 cells/uL (ref 0–200)
Basophils Relative: 0.3 %
Eosinophils Absolute: 139 cells/uL (ref 15–500)
Eosinophils Relative: 2.4 %
HCT: 41.9 % (ref 38.5–50.0)
Hemoglobin: 14.6 g/dL (ref 13.2–17.1)
Lymphs Abs: 2210 cells/uL (ref 850–3900)
MCH: 33 pg (ref 27.0–33.0)
MCHC: 34.8 g/dL (ref 32.0–36.0)
MCV: 94.8 fL (ref 80.0–100.0)
MPV: 12.5 fL (ref 7.5–12.5)
Monocytes Relative: 11.6 %
Neutro Abs: 2761 cells/uL (ref 1500–7800)
Neutrophils Relative %: 47.6 %
Platelets: 189 10*3/uL (ref 140–400)
RBC: 4.42 10*6/uL (ref 4.20–5.80)
RDW: 12.8 % (ref 11.0–15.0)
Total Lymphocyte: 38.1 %
WBC: 5.8 10*3/uL (ref 3.8–10.8)

## 2020-09-07 LAB — HIV-1 RNA QUANT-NO REFLEX-BLD
HIV 1 RNA Quant: <20 Copies/mL
HIV-1 RNA Quant, Log: <1.3 Log cps/mL

## 2020-09-07 LAB — LIPID PANEL
Cholesterol: 115 mg/dL (ref <200)
HDL: 49 mg/dL (ref >=40)
LDL Cholesterol (Calc): 50 mg/dL (calc)
Non-HDL Cholesterol (Calc): 66 mg/dL (calc) (ref <130)
Total CHOL/HDL Ratio: 2.3 (calc) (ref <5.0)
Triglycerides: 84 mg/dL (ref <150)

## 2020-09-07 LAB — FLUORESCENT TREPONEMAL AB(FTA)-IGG-BLD: Fluorescent Treponemal ABS: REACTIVE — AB

## 2020-09-07 LAB — RPR: RPR Ser Ql: REACTIVE — AB

## 2020-09-07 LAB — HEMOGLOBIN A1C
Hgb A1c MFr Bld: 8 % of total Hgb — ABNORMAL HIGH (ref <5.7)
Mean Plasma Glucose: 183 mg/dL
eAG (mmol/L): 10.1 mmol/L

## 2020-09-07 LAB — RPR TITER: RPR Titer: 1:2 {titer} — ABNORMAL HIGH

## 2020-11-14 ENCOUNTER — Telehealth: Payer: Self-pay

## 2020-11-14 NOTE — Telephone Encounter (Signed)
Received call from [facility] at [facility] requesting that Dr. Drue Second sign the death certificate for the patient. She states that the medical examiner declined the case and that she was given instructions to contact Dr. Feliz Beam office. Will route to provider.   Sandie Ano, RN

## 2020-11-14 NOTE — Telephone Encounter (Signed)
I think michelle has clarified this with the medical examiner. I don't think I should sign it since he was in good health when I last saw him and would not have any idea what the cause of the death would be. Sorry.

## 2021-01-09 ENCOUNTER — Other Ambulatory Visit: Payer: Self-pay | Admitting: Infectious Diseases

## 2021-01-09 DIAGNOSIS — B20 Human immunodeficiency virus [HIV] disease: Secondary | ICD-10-CM

## 2021-03-14 ENCOUNTER — Ambulatory Visit: Payer: Self-pay | Admitting: Internal Medicine
